# Patient Record
Sex: Male | Born: 1935 | Race: White | Hispanic: No | Marital: Married | Smoking: Former smoker
Health system: Southern US, Community
[De-identification: ages and names within clinical notes are randomized; demographics above are authoritative.]

## PROBLEM LIST (undated history)

## (undated) DIAGNOSIS — G4730 Sleep apnea, unspecified: Secondary | ICD-10-CM

## (undated) DIAGNOSIS — M1990 Unspecified osteoarthritis, unspecified site: Secondary | ICD-10-CM

## (undated) DIAGNOSIS — T7840XA Allergy, unspecified, initial encounter: Secondary | ICD-10-CM

## (undated) HISTORY — PX: REPLACEMENT TOTAL KNEE: SUR1224

## (undated) HISTORY — DX: Unspecified osteoarthritis, unspecified site: M19.90

## (undated) HISTORY — DX: Sleep apnea, unspecified: G47.30

## (undated) HISTORY — DX: Allergy, unspecified, initial encounter: T78.40XA

---

## 2004-12-26 ENCOUNTER — Other Ambulatory Visit: Payer: Self-pay

## 2004-12-26 ENCOUNTER — Inpatient Hospital Stay: Payer: Self-pay | Admitting: Internal Medicine

## 2005-01-08 ENCOUNTER — Encounter: Payer: Self-pay | Admitting: Internal Medicine

## 2005-01-12 ENCOUNTER — Encounter: Payer: Self-pay | Admitting: Internal Medicine

## 2005-02-11 ENCOUNTER — Encounter: Payer: Self-pay | Admitting: Internal Medicine

## 2011-03-01 ENCOUNTER — Ambulatory Visit: Payer: Self-pay | Admitting: Orthopedic Surgery

## 2011-03-25 ENCOUNTER — Ambulatory Visit: Payer: Self-pay | Admitting: Orthopedic Surgery

## 2011-03-25 DIAGNOSIS — Z01810 Encounter for preprocedural cardiovascular examination: Secondary | ICD-10-CM

## 2011-04-02 ENCOUNTER — Inpatient Hospital Stay: Payer: Self-pay | Admitting: Orthopedic Surgery

## 2011-04-05 LAB — PATHOLOGY REPORT

## 2011-04-08 ENCOUNTER — Encounter: Payer: Self-pay | Admitting: Internal Medicine

## 2011-04-14 ENCOUNTER — Encounter: Payer: Self-pay | Admitting: Internal Medicine

## 2011-05-15 ENCOUNTER — Encounter: Payer: Self-pay | Admitting: Internal Medicine

## 2012-06-24 IMAGING — CT CT OF THE RIGHT KNEE WITHOUT CONTRAST
2 of 3 series · 10 of 14 positions shown, 11 images · non-contrast
Comparison: none

REASON FOR EXAM: MY KNEE CT SCAN right knee pain
COMMENTS:

PROCEDURE:     CT  - CT KNEE RIGHT WO  - March 01, 2011  [DATE]
RESULT:     This study was performed prior to right knee replacement. This
is a preplanning evaluation.

[Series 2: hip · axial · 0.39mm/px · z∈[+88,+352]mm · 3 of 177 slices shown, 4 images]
[im 1/177  soft-tissue]
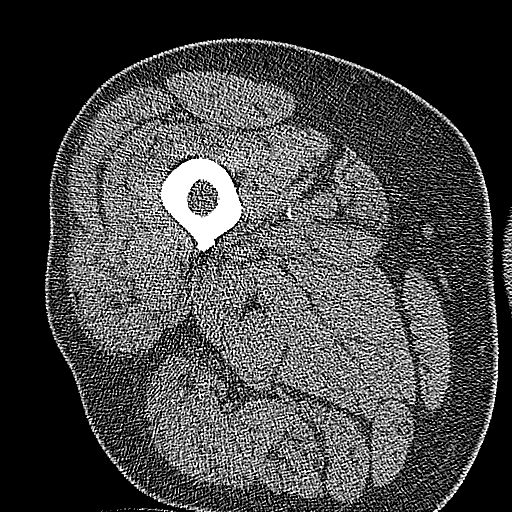
[im 1/177  bone]
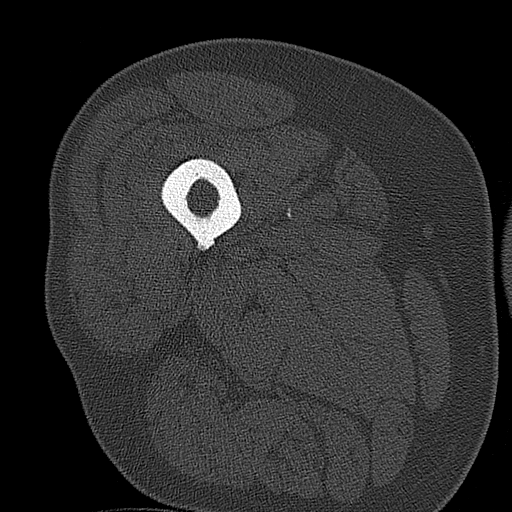
[im 89/177  bone]
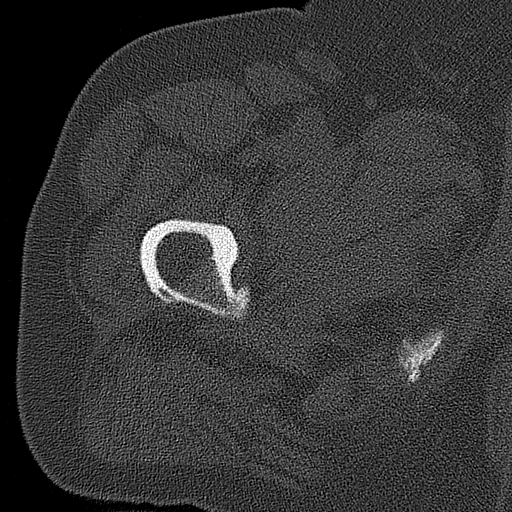
[im 177/177  bone]
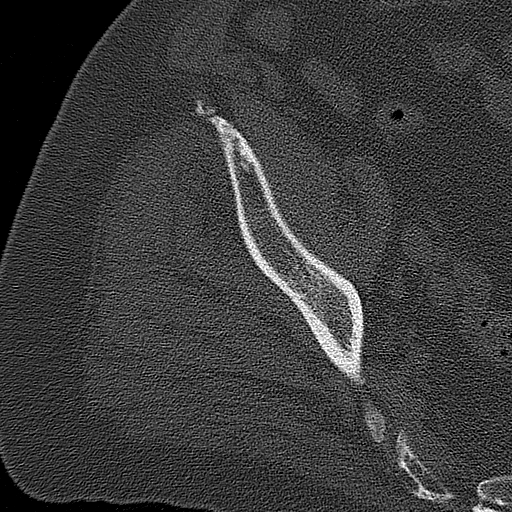

[Series 4: knee (id) smooth · axial · 0.39mm/px · z∈[-249,-21]mm · 7 of 610 slices shown]
[im 77/610  soft-tissue]
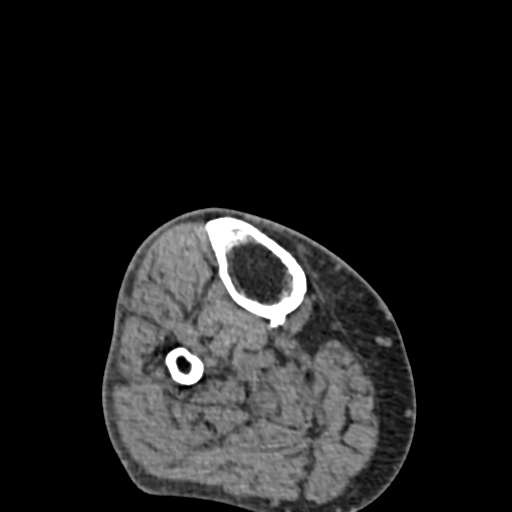
[im 153/610  soft-tissue]
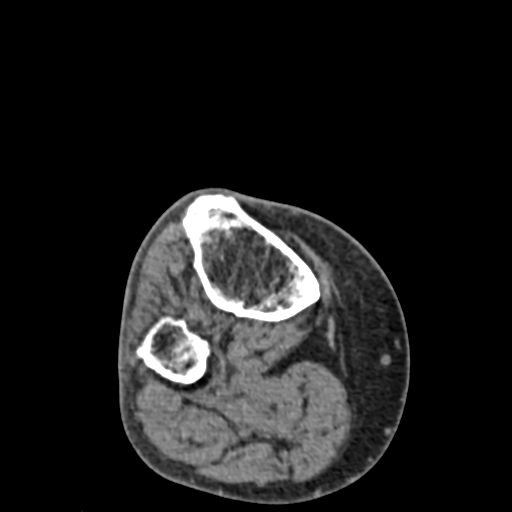
[im 229/610  soft-tissue]
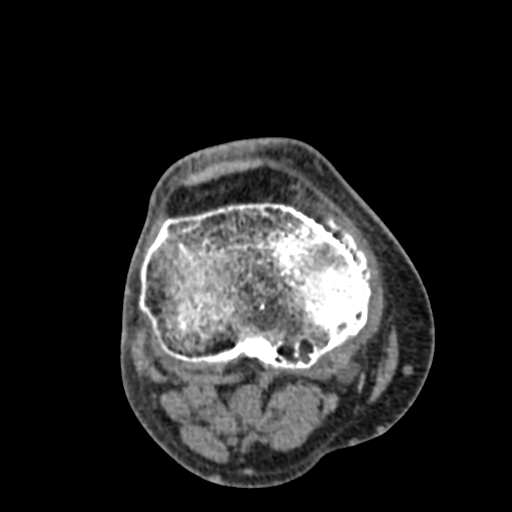
[im 305/610  soft-tissue]
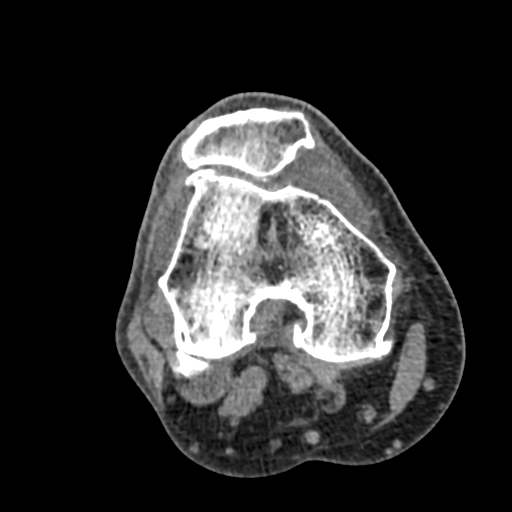
[im 381/610  soft-tissue]
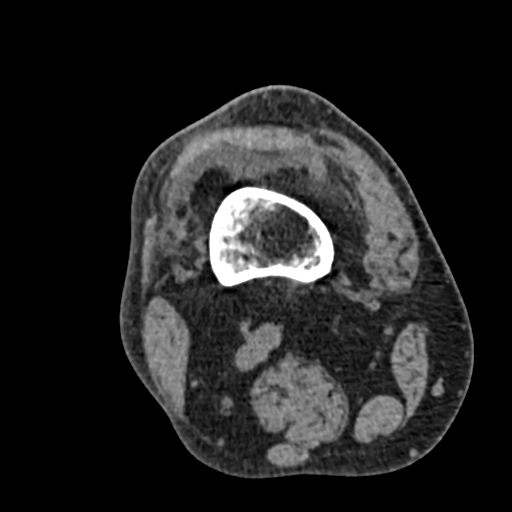
[im 457/610  soft-tissue]
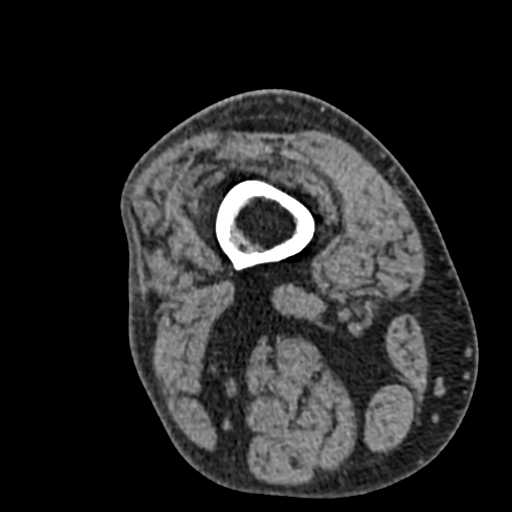
[im 533/610  soft-tissue]
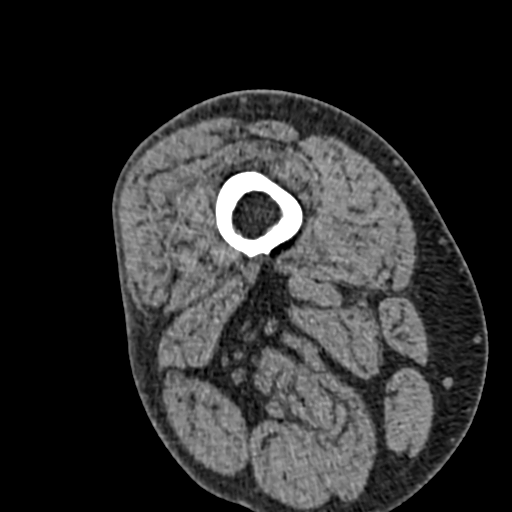

[10 of 14 positions shown; findings below may reference images not displayed]

FINDINGS: Evaluation of the right knee demonstrates severe osteoarthritic
changes. There is osteophytosis, subchondral cyst formation and subchondral
sclerosis. Severe degenerative changes are also identified within the ankle
with similar findings. The remaining osseous structures demonstrate no
further abnormalities. The surrounding soft tissues are unremarkable.
IMPRESSION: Severe osteoarthritic changes involving the knee and ankle
as described above.

## 2013-03-14 ENCOUNTER — Emergency Department: Payer: Self-pay | Admitting: Emergency Medicine

## 2013-03-14 LAB — CBC
HGB: 12.7 g/dL — ABNORMAL LOW (ref 13.0–18.0)
MCH: 32.9 pg (ref 26.0–34.0)
MCHC: 34 g/dL (ref 32.0–36.0)
MCV: 97 fL (ref 80–100)
Platelet: 158 10*3/uL (ref 150–440)
WBC: 9.9 10*3/uL (ref 3.8–10.6)

## 2013-03-14 LAB — BASIC METABOLIC PANEL
BUN: 23 mg/dL — ABNORMAL HIGH (ref 7–18)
Co2: 29 mmol/L (ref 21–32)
Creatinine: 1.1 mg/dL (ref 0.60–1.30)
EGFR (Non-African Amer.): >60
Glucose: 121 mg/dL — ABNORMAL HIGH (ref 65–99)
Potassium: 4.5 mmol/L (ref 3.5–5.1)
Sodium: 143 mmol/L (ref 136–145)

## 2013-04-05 ENCOUNTER — Emergency Department: Payer: Self-pay | Admitting: Emergency Medicine

## 2013-04-05 LAB — BASIC METABOLIC PANEL
Chloride: 109 mmol/L — ABNORMAL HIGH (ref 98–107)
Glucose: 111 mg/dL — ABNORMAL HIGH (ref 65–99)
Osmolality: 286 (ref 275–301)
Sodium: 142 mmol/L (ref 136–145)

## 2013-04-05 LAB — CBC
HCT: 37.1 % — ABNORMAL LOW (ref 40.0–52.0)
HGB: 12.9 g/dL — ABNORMAL LOW (ref 13.0–18.0)
MCH: 33.3 pg (ref 26.0–34.0)
Platelet: 159 10*3/uL (ref 150–440)
RBC: 3.86 10*6/uL — ABNORMAL LOW (ref 4.40–5.90)
WBC: 7.4 10*3/uL (ref 3.8–10.6)

## 2013-04-05 LAB — URINALYSIS, COMPLETE
Bacteria: NONE SEEN
Bilirubin,UR: NEGATIVE
Blood: NEGATIVE
Ketone: NEGATIVE
Nitrite: NEGATIVE
Ph: 5 (ref 4.5–8.0)
Specific Gravity: 1.015 (ref 1.003–1.030)

## 2013-08-16 ENCOUNTER — Ambulatory Visit: Payer: Self-pay | Admitting: Orthopedic Surgery

## 2013-09-28 DIAGNOSIS — L120 Bullous pemphigoid: Secondary | ICD-10-CM | POA: Insufficient documentation

## 2014-05-18 ENCOUNTER — Ambulatory Visit: Payer: Self-pay | Admitting: Podiatry

## 2014-07-08 IMAGING — CR DG CHEST 2V
1 series · 2 of 2 positions shown · non-contrast
Comparison: none

REASON FOR EXAM: SOB
COMMENTS:

PROCEDURE:     DXR - DXR CHEST PA (OR AP) AND LATERAL  - March 14, 2013  [DATE]
RESULT:     There is no previous exam for comparison.
The cardiac silhouette is enlarged. There is no edema, infiltrate, effusion,
mass or pneumothorax. Degenerative bony changes are present.

[Series 1: w chest pa · 0.14mm/px · 2 of 2 slices shown]
[im 1/2]
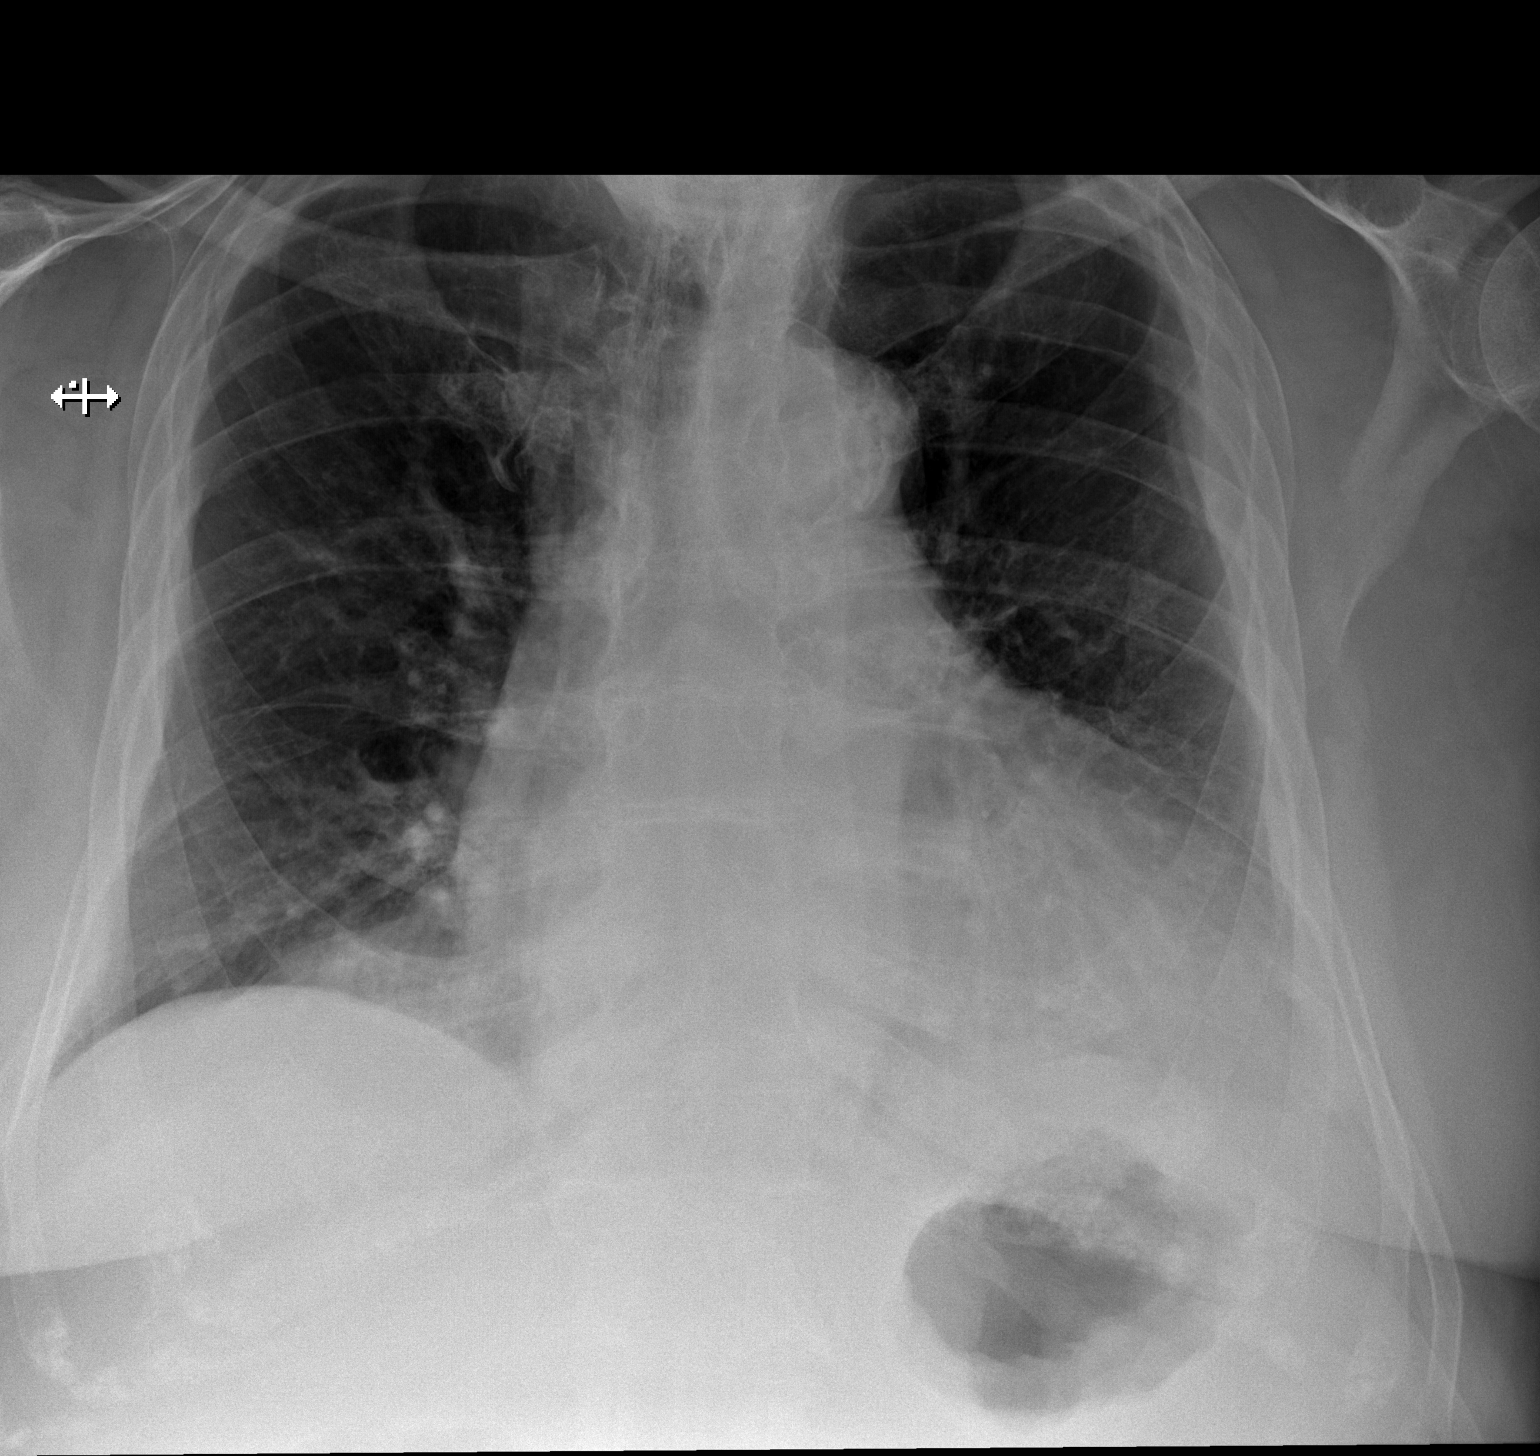
[im 2/2]
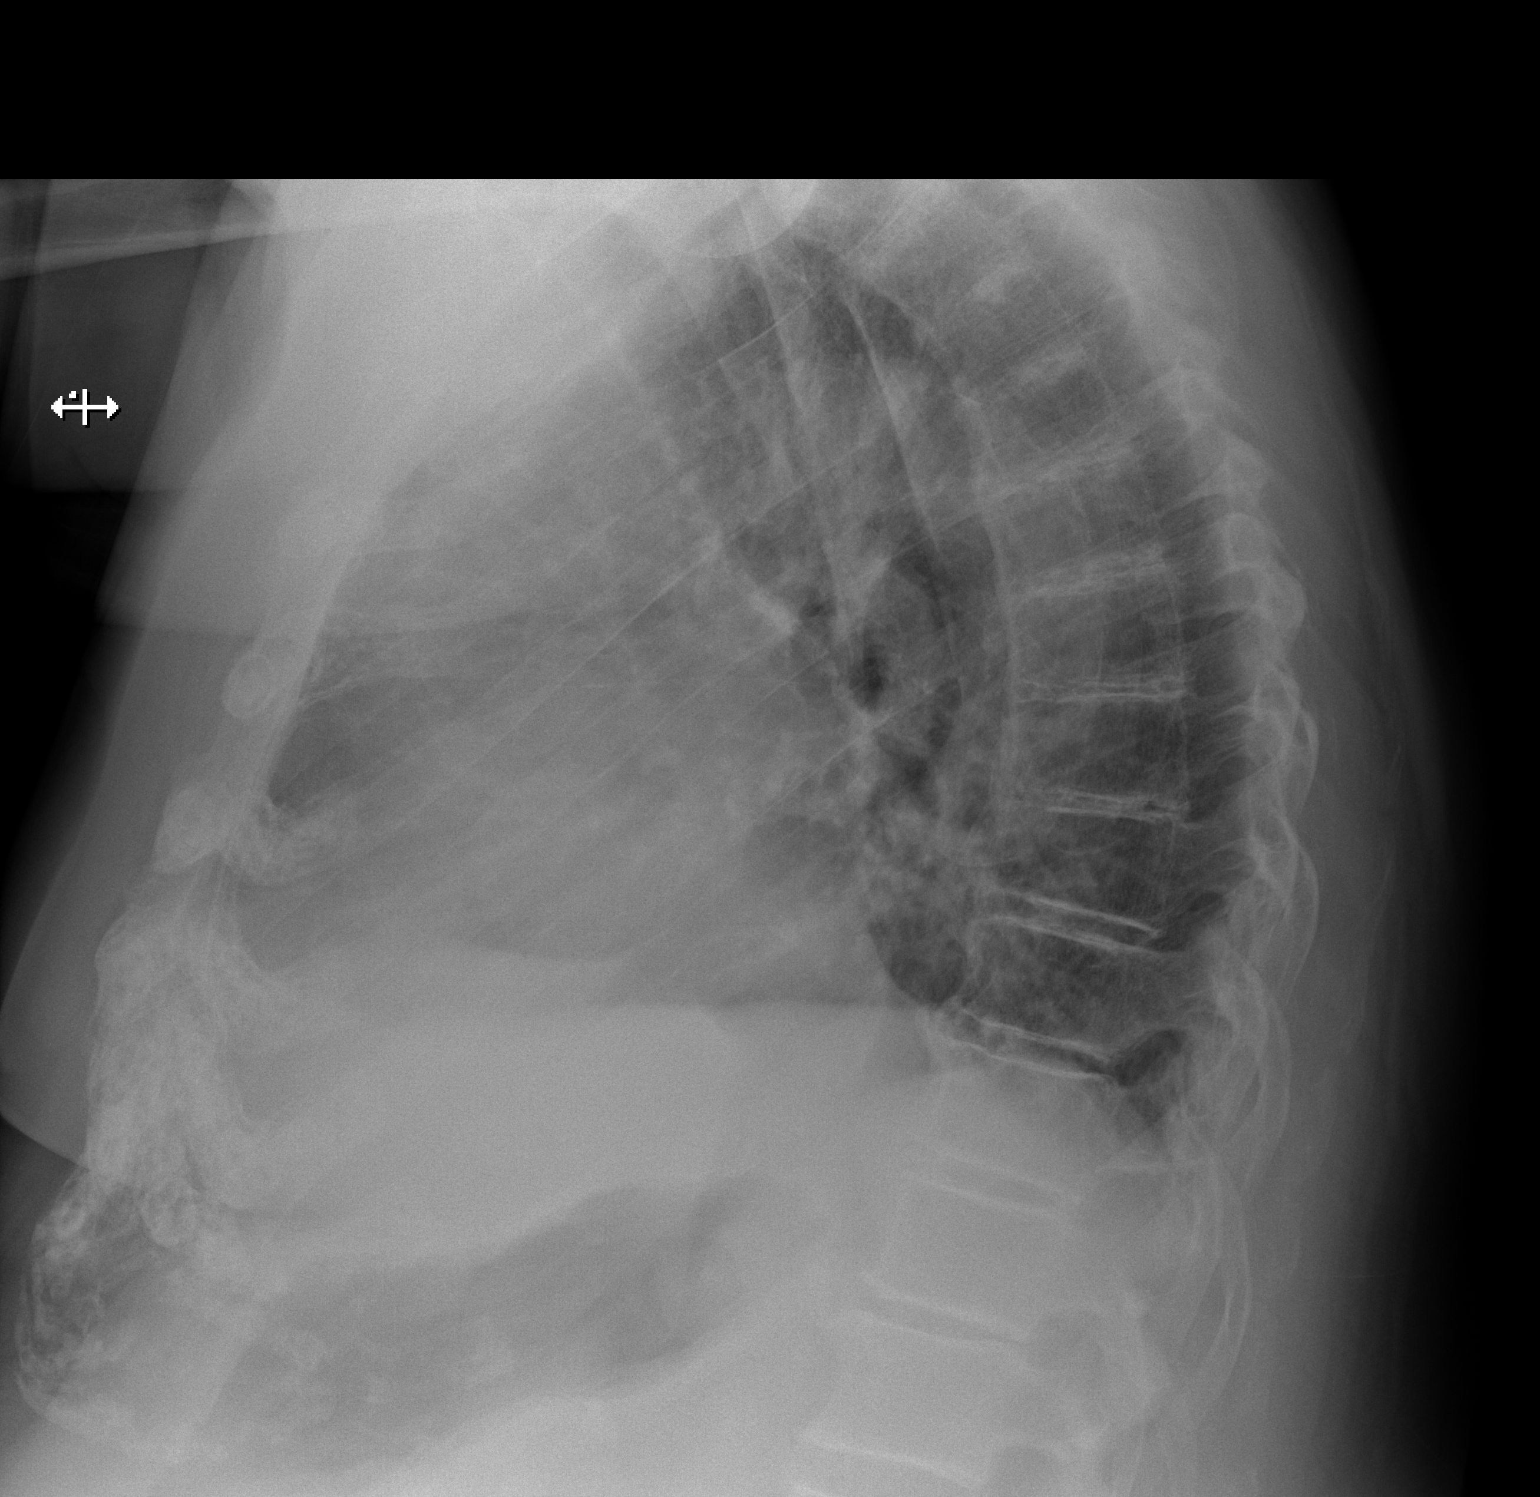

[2 of 2 positions shown; findings below may reference images not displayed]

IMPRESSION: 1. Cardiomegaly. The lungs appear clear.

[REDACTED]

## 2014-08-10 ENCOUNTER — Other Ambulatory Visit: Payer: Self-pay | Admitting: *Deleted

## 2014-08-10 ENCOUNTER — Ambulatory Visit (INDEPENDENT_AMBULATORY_CARE_PROVIDER_SITE_OTHER): Payer: Commercial Managed Care - HMO

## 2014-08-10 ENCOUNTER — Ambulatory Visit (INDEPENDENT_AMBULATORY_CARE_PROVIDER_SITE_OTHER): Payer: Commercial Managed Care - HMO | Admitting: Podiatry

## 2014-08-10 ENCOUNTER — Encounter: Payer: Self-pay | Admitting: Podiatry

## 2014-08-10 VITALS — BP 132/69 | HR 77 | Resp 16 | Ht 70.0 in | Wt 235.0 lb

## 2014-08-10 DIAGNOSIS — M7750 Other enthesopathy of unspecified foot: Secondary | ICD-10-CM

## 2014-08-10 DIAGNOSIS — M1990 Unspecified osteoarthritis, unspecified site: Secondary | ICD-10-CM

## 2014-08-10 NOTE — Progress Notes (Signed)
   Subjective:    Patient ID: Todd Wall, male    DOB: 08/12/1926, 80 y.o.   MRN: [id number]  HPI Comments: Both ankles are hurting , they have been getting worse the last 5 years. Hurts in the joints.  It gets so sore i can barely walk. If on it all day the night it worse. Cant stand for longer thank 15 minutes   Foot Pain      Review of Systems  HENT:       Sinus problems   All other systems reviewed and are negative.      Objective:   Physical Exam: I have reviewed his past medical history medications allergies surgery social history and review of systems area pulses are strongly palpable bilateral. Neurologic sensorium is intact per Semmes-Weinstein monofilament. Deep tendon reflexes are intact bilateral muscle strength +5 over 5 dorsiflexion plantar flexion inversion everters all of his musculature is intact. Orthopedic evaluation of his trace pain on range of motion of the ankles of the ankle joint. Pain on medial and lateral compression of the bilateral ankles. Radiographic evaluation does demonstrate considerable osteoarthritis the bilateral ankles with joint space narrowing and ankle mortise abnormalities. Spurring and joint space narrowing is noted indicative of osteoarthritis      Assessment & Plan:  Assessment: Osteoarthritis bilateral ankles. Capsulitis bilateral ankles.  Plan: Injected cortisone to the bilateral ankles today I will follow up with him as needed.

## 2014-10-25 DIAGNOSIS — L718 Other rosacea: Secondary | ICD-10-CM | POA: Diagnosis not present

## 2014-10-25 DIAGNOSIS — L821 Other seborrheic keratosis: Secondary | ICD-10-CM | POA: Diagnosis not present

## 2014-10-25 DIAGNOSIS — R21 Rash and other nonspecific skin eruption: Secondary | ICD-10-CM | POA: Diagnosis not present

## 2014-10-25 DIAGNOSIS — L120 Bullous pemphigoid: Secondary | ICD-10-CM | POA: Diagnosis not present

## 2014-10-25 DIAGNOSIS — Z79899 Other long term (current) drug therapy: Secondary | ICD-10-CM | POA: Diagnosis not present

## 2014-10-31 DIAGNOSIS — R21 Rash and other nonspecific skin eruption: Secondary | ICD-10-CM | POA: Diagnosis not present

## 2014-10-31 DIAGNOSIS — Z79899 Other long term (current) drug therapy: Secondary | ICD-10-CM | POA: Diagnosis not present

## 2015-01-10 DIAGNOSIS — F5104 Psychophysiologic insomnia: Secondary | ICD-10-CM | POA: Diagnosis not present

## 2015-01-10 DIAGNOSIS — D508 Other iron deficiency anemias: Secondary | ICD-10-CM | POA: Diagnosis not present

## 2015-01-10 DIAGNOSIS — D608 Other acquired pure red cell aplasias: Secondary | ICD-10-CM | POA: Diagnosis not present

## 2015-01-31 DIAGNOSIS — L820 Inflamed seborrheic keratosis: Secondary | ICD-10-CM | POA: Diagnosis not present

## 2015-01-31 DIAGNOSIS — L120 Bullous pemphigoid: Secondary | ICD-10-CM | POA: Diagnosis not present

## 2015-02-14 DIAGNOSIS — Z79899 Other long term (current) drug therapy: Secondary | ICD-10-CM | POA: Diagnosis not present

## 2015-03-02 DIAGNOSIS — J209 Acute bronchitis, unspecified: Secondary | ICD-10-CM | POA: Diagnosis not present

## 2015-03-03 DIAGNOSIS — R0602 Shortness of breath: Secondary | ICD-10-CM | POA: Diagnosis not present

## 2015-03-03 DIAGNOSIS — R062 Wheezing: Secondary | ICD-10-CM | POA: Diagnosis not present

## 2015-03-03 DIAGNOSIS — R05 Cough: Secondary | ICD-10-CM | POA: Diagnosis not present

## 2015-03-03 DIAGNOSIS — J449 Chronic obstructive pulmonary disease, unspecified: Secondary | ICD-10-CM | POA: Diagnosis not present

## 2015-03-05 DIAGNOSIS — J40 Bronchitis, not specified as acute or chronic: Secondary | ICD-10-CM | POA: Diagnosis not present

## 2015-03-16 DIAGNOSIS — R0602 Shortness of breath: Secondary | ICD-10-CM | POA: Diagnosis not present

## 2015-03-16 DIAGNOSIS — J40 Bronchitis, not specified as acute or chronic: Secondary | ICD-10-CM | POA: Diagnosis not present

## 2015-03-16 DIAGNOSIS — R05 Cough: Secondary | ICD-10-CM | POA: Diagnosis not present

## 2015-04-06 DIAGNOSIS — G479 Sleep disorder, unspecified: Secondary | ICD-10-CM | POA: Diagnosis not present

## 2015-04-06 DIAGNOSIS — Z8709 Personal history of other diseases of the respiratory system: Secondary | ICD-10-CM | POA: Diagnosis not present

## 2015-04-13 DIAGNOSIS — R062 Wheezing: Secondary | ICD-10-CM | POA: Diagnosis not present

## 2015-04-13 DIAGNOSIS — B9689 Other specified bacterial agents as the cause of diseases classified elsewhere: Secondary | ICD-10-CM | POA: Diagnosis not present

## 2015-04-13 DIAGNOSIS — J208 Acute bronchitis due to other specified organisms: Secondary | ICD-10-CM | POA: Diagnosis not present

## 2015-04-20 DIAGNOSIS — R0602 Shortness of breath: Secondary | ICD-10-CM | POA: Diagnosis not present

## 2015-04-20 DIAGNOSIS — R05 Cough: Secondary | ICD-10-CM | POA: Diagnosis not present

## 2015-06-02 DIAGNOSIS — R21 Rash and other nonspecific skin eruption: Secondary | ICD-10-CM | POA: Diagnosis not present

## 2015-06-02 DIAGNOSIS — L988 Other specified disorders of the skin and subcutaneous tissue: Secondary | ICD-10-CM | POA: Diagnosis not present

## 2015-06-02 DIAGNOSIS — L120 Bullous pemphigoid: Secondary | ICD-10-CM | POA: Diagnosis not present

## 2015-06-08 DIAGNOSIS — Z79899 Other long term (current) drug therapy: Secondary | ICD-10-CM | POA: Diagnosis not present

## 2015-06-09 DIAGNOSIS — L120 Bullous pemphigoid: Secondary | ICD-10-CM | POA: Diagnosis not present

## 2015-06-09 DIAGNOSIS — R21 Rash and other nonspecific skin eruption: Secondary | ICD-10-CM | POA: Diagnosis not present

## 2015-06-09 DIAGNOSIS — T50904A Poisoning by unspecified drugs, medicaments and biological substances, undetermined, initial encounter: Secondary | ICD-10-CM | POA: Diagnosis not present

## 2015-08-28 DIAGNOSIS — H6523 Chronic serous otitis media, bilateral: Secondary | ICD-10-CM | POA: Diagnosis not present

## 2015-08-28 DIAGNOSIS — Z23 Encounter for immunization: Secondary | ICD-10-CM | POA: Diagnosis not present

## 2015-09-26 DIAGNOSIS — H65192 Other acute nonsuppurative otitis media, left ear: Secondary | ICD-10-CM | POA: Diagnosis not present

## 2015-10-04 DIAGNOSIS — H6502 Acute serous otitis media, left ear: Secondary | ICD-10-CM | POA: Diagnosis not present

## 2015-10-04 DIAGNOSIS — R21 Rash and other nonspecific skin eruption: Secondary | ICD-10-CM | POA: Diagnosis not present

## 2015-10-04 DIAGNOSIS — R7309 Other abnormal glucose: Secondary | ICD-10-CM | POA: Diagnosis not present

## 2015-10-26 DIAGNOSIS — H6502 Acute serous otitis media, left ear: Secondary | ICD-10-CM | POA: Diagnosis not present

## 2015-10-26 DIAGNOSIS — H90A11 Conductive hearing loss, unilateral, right ear with restricted hearing on the contralateral side: Secondary | ICD-10-CM | POA: Diagnosis not present

## 2015-10-26 DIAGNOSIS — H6982 Other specified disorders of Eustachian tube, left ear: Secondary | ICD-10-CM | POA: Diagnosis not present

## 2015-10-26 DIAGNOSIS — H903 Sensorineural hearing loss, bilateral: Secondary | ICD-10-CM | POA: Diagnosis not present

## 2015-10-31 DIAGNOSIS — M545 Low back pain: Secondary | ICD-10-CM | POA: Diagnosis not present

## 2015-11-16 DIAGNOSIS — H902 Conductive hearing loss, unspecified: Secondary | ICD-10-CM | POA: Diagnosis not present

## 2015-11-16 DIAGNOSIS — H6502 Acute serous otitis media, left ear: Secondary | ICD-10-CM | POA: Diagnosis not present

## 2015-11-16 DIAGNOSIS — H6982 Other specified disorders of Eustachian tube, left ear: Secondary | ICD-10-CM | POA: Diagnosis not present

## 2015-12-28 DIAGNOSIS — H902 Conductive hearing loss, unspecified: Secondary | ICD-10-CM | POA: Diagnosis not present

## 2015-12-28 DIAGNOSIS — H6982 Other specified disorders of Eustachian tube, left ear: Secondary | ICD-10-CM | POA: Diagnosis not present

## 2015-12-28 DIAGNOSIS — H6522 Chronic serous otitis media, left ear: Secondary | ICD-10-CM | POA: Diagnosis not present

## 2016-01-18 DIAGNOSIS — H903 Sensorineural hearing loss, bilateral: Secondary | ICD-10-CM | POA: Diagnosis not present

## 2016-01-23 DIAGNOSIS — J209 Acute bronchitis, unspecified: Secondary | ICD-10-CM | POA: Diagnosis not present

## 2016-01-23 DIAGNOSIS — R05 Cough: Secondary | ICD-10-CM | POA: Diagnosis not present

## 2016-01-30 DIAGNOSIS — L120 Bullous pemphigoid: Secondary | ICD-10-CM | POA: Diagnosis not present

## 2016-01-30 DIAGNOSIS — L853 Xerosis cutis: Secondary | ICD-10-CM | POA: Diagnosis not present

## 2016-01-30 DIAGNOSIS — D692 Other nonthrombocytopenic purpura: Secondary | ICD-10-CM | POA: Diagnosis not present

## 2016-02-09 DIAGNOSIS — Z79899 Other long term (current) drug therapy: Secondary | ICD-10-CM | POA: Diagnosis not present

## 2016-02-16 DIAGNOSIS — M25571 Pain in right ankle and joints of right foot: Secondary | ICD-10-CM | POA: Diagnosis not present

## 2016-02-16 DIAGNOSIS — M25572 Pain in left ankle and joints of left foot: Secondary | ICD-10-CM | POA: Diagnosis not present

## 2016-02-21 ENCOUNTER — Ambulatory Visit: Payer: Commercial Managed Care - HMO | Admitting: Podiatry

## 2016-03-07 DIAGNOSIS — M19071 Primary osteoarthritis, right ankle and foot: Secondary | ICD-10-CM | POA: Diagnosis not present

## 2016-03-07 DIAGNOSIS — M19072 Primary osteoarthritis, left ankle and foot: Secondary | ICD-10-CM | POA: Diagnosis not present

## 2016-04-17 DIAGNOSIS — R600 Localized edema: Secondary | ICD-10-CM | POA: Diagnosis not present

## 2016-11-12 DIAGNOSIS — R238 Other skin changes: Secondary | ICD-10-CM | POA: Diagnosis not present

## 2016-11-12 DIAGNOSIS — Z79899 Other long term (current) drug therapy: Secondary | ICD-10-CM | POA: Diagnosis not present

## 2016-11-12 DIAGNOSIS — L308 Other specified dermatitis: Secondary | ICD-10-CM | POA: Diagnosis not present

## 2016-11-12 DIAGNOSIS — R21 Rash and other nonspecific skin eruption: Secondary | ICD-10-CM | POA: Diagnosis not present

## 2016-11-12 DIAGNOSIS — D649 Anemia, unspecified: Secondary | ICD-10-CM | POA: Diagnosis not present

## 2016-11-12 DIAGNOSIS — L120 Bullous pemphigoid: Secondary | ICD-10-CM | POA: Diagnosis not present

## 2016-11-19 DIAGNOSIS — L120 Bullous pemphigoid: Secondary | ICD-10-CM | POA: Diagnosis not present

## 2016-11-19 DIAGNOSIS — L309 Dermatitis, unspecified: Secondary | ICD-10-CM | POA: Diagnosis not present

## 2016-11-19 DIAGNOSIS — Z79899 Other long term (current) drug therapy: Secondary | ICD-10-CM | POA: Diagnosis not present

## 2017-01-21 DIAGNOSIS — F5104 Psychophysiologic insomnia: Secondary | ICD-10-CM | POA: Diagnosis not present

## 2017-02-11 DIAGNOSIS — R29898 Other symptoms and signs involving the musculoskeletal system: Secondary | ICD-10-CM | POA: Diagnosis not present

## 2017-02-11 DIAGNOSIS — M6281 Muscle weakness (generalized): Secondary | ICD-10-CM | POA: Diagnosis not present

## 2017-02-11 DIAGNOSIS — G4700 Insomnia, unspecified: Secondary | ICD-10-CM | POA: Diagnosis not present

## 2017-02-11 DIAGNOSIS — R2689 Other abnormalities of gait and mobility: Secondary | ICD-10-CM | POA: Diagnosis not present

## 2017-04-01 DIAGNOSIS — R350 Frequency of micturition: Secondary | ICD-10-CM | POA: Diagnosis not present

## 2017-04-01 DIAGNOSIS — M25551 Pain in right hip: Secondary | ICD-10-CM | POA: Diagnosis not present

## 2017-04-08 DIAGNOSIS — G4700 Insomnia, unspecified: Secondary | ICD-10-CM | POA: Diagnosis not present

## 2017-04-10 DIAGNOSIS — G4700 Insomnia, unspecified: Secondary | ICD-10-CM | POA: Diagnosis not present

## 2017-04-10 DIAGNOSIS — M6281 Muscle weakness (generalized): Secondary | ICD-10-CM | POA: Diagnosis not present

## 2017-04-10 DIAGNOSIS — R29898 Other symptoms and signs involving the musculoskeletal system: Secondary | ICD-10-CM | POA: Diagnosis not present

## 2017-04-10 DIAGNOSIS — R2689 Other abnormalities of gait and mobility: Secondary | ICD-10-CM | POA: Diagnosis not present

## 2017-05-10 DIAGNOSIS — M6281 Muscle weakness (generalized): Secondary | ICD-10-CM | POA: Diagnosis not present

## 2017-05-10 DIAGNOSIS — R29898 Other symptoms and signs involving the musculoskeletal system: Secondary | ICD-10-CM | POA: Diagnosis not present

## 2017-05-10 DIAGNOSIS — R2689 Other abnormalities of gait and mobility: Secondary | ICD-10-CM | POA: Diagnosis not present

## 2017-05-10 DIAGNOSIS — G4700 Insomnia, unspecified: Secondary | ICD-10-CM | POA: Diagnosis not present

## 2017-06-10 DIAGNOSIS — R2689 Other abnormalities of gait and mobility: Secondary | ICD-10-CM | POA: Diagnosis not present

## 2017-06-10 DIAGNOSIS — G4700 Insomnia, unspecified: Secondary | ICD-10-CM | POA: Diagnosis not present

## 2017-06-10 DIAGNOSIS — R29898 Other symptoms and signs involving the musculoskeletal system: Secondary | ICD-10-CM | POA: Diagnosis not present

## 2017-06-10 DIAGNOSIS — M6281 Muscle weakness (generalized): Secondary | ICD-10-CM | POA: Diagnosis not present

## 2017-07-11 DIAGNOSIS — G4700 Insomnia, unspecified: Secondary | ICD-10-CM | POA: Diagnosis not present

## 2017-07-11 DIAGNOSIS — R2689 Other abnormalities of gait and mobility: Secondary | ICD-10-CM | POA: Diagnosis not present

## 2017-07-11 DIAGNOSIS — R29898 Other symptoms and signs involving the musculoskeletal system: Secondary | ICD-10-CM | POA: Diagnosis not present

## 2017-07-11 DIAGNOSIS — M6281 Muscle weakness (generalized): Secondary | ICD-10-CM | POA: Diagnosis not present

## 2017-07-15 DIAGNOSIS — Z23 Encounter for immunization: Secondary | ICD-10-CM | POA: Diagnosis not present

## 2017-07-15 DIAGNOSIS — S61412A Laceration without foreign body of left hand, initial encounter: Secondary | ICD-10-CM | POA: Diagnosis not present

## 2017-07-17 DIAGNOSIS — S61412D Laceration without foreign body of left hand, subsequent encounter: Secondary | ICD-10-CM | POA: Diagnosis not present

## 2017-08-10 DIAGNOSIS — R2689 Other abnormalities of gait and mobility: Secondary | ICD-10-CM | POA: Diagnosis not present

## 2017-08-10 DIAGNOSIS — M6281 Muscle weakness (generalized): Secondary | ICD-10-CM | POA: Diagnosis not present

## 2017-08-10 DIAGNOSIS — G4700 Insomnia, unspecified: Secondary | ICD-10-CM | POA: Diagnosis not present

## 2017-08-10 DIAGNOSIS — R29898 Other symptoms and signs involving the musculoskeletal system: Secondary | ICD-10-CM | POA: Diagnosis not present

## 2017-09-08 DIAGNOSIS — Z0000 Encounter for general adult medical examination without abnormal findings: Secondary | ICD-10-CM | POA: Diagnosis not present

## 2017-09-08 DIAGNOSIS — L821 Other seborrheic keratosis: Secondary | ICD-10-CM | POA: Diagnosis not present

## 2017-09-08 DIAGNOSIS — L120 Bullous pemphigoid: Secondary | ICD-10-CM | POA: Diagnosis not present

## 2017-09-08 DIAGNOSIS — Z111 Encounter for screening for respiratory tuberculosis: Secondary | ICD-10-CM | POA: Diagnosis not present

## 2017-09-08 DIAGNOSIS — D692 Other nonthrombocytopenic purpura: Secondary | ICD-10-CM | POA: Diagnosis not present

## 2017-09-08 DIAGNOSIS — L308 Other specified dermatitis: Secondary | ICD-10-CM | POA: Diagnosis not present

## 2017-09-10 DIAGNOSIS — R2689 Other abnormalities of gait and mobility: Secondary | ICD-10-CM | POA: Diagnosis not present

## 2017-09-10 DIAGNOSIS — G4700 Insomnia, unspecified: Secondary | ICD-10-CM | POA: Diagnosis not present

## 2017-09-10 DIAGNOSIS — R29898 Other symptoms and signs involving the musculoskeletal system: Secondary | ICD-10-CM | POA: Diagnosis not present

## 2017-09-10 DIAGNOSIS — M6281 Muscle weakness (generalized): Secondary | ICD-10-CM | POA: Diagnosis not present

## 2017-09-29 ENCOUNTER — Encounter: Payer: Medicare PPO | Attending: Family Medicine | Admitting: Dietician

## 2017-09-29 ENCOUNTER — Encounter: Payer: Self-pay | Admitting: Dietician

## 2017-09-29 VITALS — BP 136/68 | Ht 70.0 in | Wt 249.5 lb

## 2017-09-29 DIAGNOSIS — Z713 Dietary counseling and surveillance: Secondary | ICD-10-CM | POA: Diagnosis not present

## 2017-09-29 DIAGNOSIS — E119 Type 2 diabetes mellitus without complications: Secondary | ICD-10-CM | POA: Insufficient documentation

## 2017-09-29 NOTE — Progress Notes (Signed)
Diabetes Self-Management Education  Visit Type: First/Initial  Appt. Start Time: 1100 Appt. End Time: 1215  09/29/2017  Mr. Todd Wall, identified by name and date of birth, is a 83 y.o. male with a diagnosis of Diabetes: Type 2.   ASSESSMENT  Blood pressure 136/68, height 5\' 10"  (1.778 m), weight 249 lb 8 oz (113.2 kg). Body mass index is 35.8 kg/m.  Diabetes Self-Management Education - 09/29/17 1336      Visit Information   Visit Type  First/Initial      Initial Visit   Diabetes Type  Type 2      Health Coping   How would you rate your overall health?  Good      Psychosocial Assessment   Patient Belief/Attitude about Diabetes  Motivated to manage diabetes    Self-care barriers  Unsteady gait/risk for falls uses scooter chair    Self-management support  Doctor's office;Family    Other persons present  Patient;Spouse/SO    Patient Concerns  Nutrition/Meal planning    Special Needs  None    Preferred Learning Style  Visual    Learning Readiness  Ready      Pre-Education Assessment   Patient understands the diabetes disease and treatment process.  Needs Instruction    Patient understands incorporating nutritional management into lifestyle.  Needs Instruction    Patient undertands incorporating physical activity into lifestyle.  Needs Instruction    Patient understands using medications safely.  Needs Instruction    Patient understands monitoring blood glucose, interpreting and using results  Needs Instruction    Patient understands prevention, detection, and treatment of acute complications.  Needs Instruction    Patient understands prevention, detection, and treatment of chronic complications.  Needs Instruction    Patient understands how to develop strategies to address psychosocial issues.  Needs Instruction    Patient understands how to develop strategies to promote health/change behavior.  Needs Instruction      Complications   Last HgB A1C per patient/outside  source  6.7 % 09-08-17    How often do you check your blood sugar?  0 times/day (not testing)    Have you had a dilated eye exam in the past 12 months?  No 3 year ago    Have you had a dental exam in the past 12 months?  No 3 yr ago    Are you checking your feet?  No      Dietary Intake   Breakfast  eats 2 eggs and bagel with cream cheese at 10-11a    Snack (morning)  no    Lunch  eats out daily for lunch-eats fried chicken strips at [employer] (eats lunch at 3p-4p)   Snack (afternoon)  no    Dinner  eats at [employer]7p-8p    Snack (evening)  eats occasional peanuts at 10p    Beverage(s)  drinks water 1-2x/day and artificially sweetened beverages 4-5x/day      Exercise   Exercise Type  -- pt has limited mobility-rides in scooter chair      Patient Education   Previous Diabetes Education  No    Disease state   Definition of diabetes, type 1 and 2, and the diagnosis of diabetes;Explored patient's options for treatment of their diabetes    Nutrition management   Role of diet in the treatment of diabetes and the relationship between the three main macronutrients and blood glucose level;Carbohydrate counting;Food label reading, portion sizes and measuring food.    Physical activity and exercise  Role of exercise on diabetes management, blood pressure control and cardiac health.    Monitoring  Taught/evaluated SMBG meter.;Taught/discussed recording of test results and interpretation of SMBG.;Identified appropriate SMBG and/or A1C goals.;Purpose and frequency of SMBG. gave pt One Touch Verio Flex & instructed on its use-BG 142 (2 hr PP)    Chronic complications  Relationship between chronic complications and blood glucose control;Dental care;Retinopathy and reason for yearly dilated eye exams    Personal strategies to promote health  Lifestyle issues that need to be addressed for better diabetes care;Helped patient develop diabetes management plan for (enter comment)      Outcomes   Expected Outcomes   Demonstrated interest in learning. Expect positive outcomes       Individualized Plan for Diabetes Self-Management Training:   Learning Objective:  Patient will have a greater understanding of diabetes self-management. Patient education plan is to attend individual and/or group sessions per assessed needs and concerns.   Plan:   Patient Instructions   Check blood sugars 2 x day before breakfast and 2 hrs after supper every day and record Bring blood sugar records to the next appointment/class Call your doctor for a prescription for:  1. Meter strips (type) One Touch Verio test strips  checking  2 times per day  2. Lancets (type) One Touch Delica Lancets checking 2                      times per day Exercise:  Try some exercises in "Workout To Go" handout Eat 3 meals day,  1  snack at bedtime Eat 3 carbohydrate servings/meal + protein Eat 1 carbohydrate serving/snack + protein Space meals 4-5 hours apart Avoid sugar sweetened drinks and fruit juices  Limit intake of fried foods and sweets/desserts Make dentist / eye doctor appointments Get a Sharps container Return for appointment/classes on: 10-16-17   Expected Outcomes:  Demonstrated interest in learning. Expect positive outcomes  Education material provided:General meal planning guidelines, Workout To Go handout, One Touch Verio Flex meter   If problems or questions, patient to contact team via: [phone]  Future DSME appointment:  10-16-17

## 2017-09-29 NOTE — Patient Instructions (Signed)
  Check blood sugars 2 x day before breakfast and 2 hrs after supper every day and record Bring blood sugar records to the next appointment/class Call your doctor for a prescription for:  1. Meter strips (type) One Touch Verio test strips  checking  2 times per day  2. Lancets (type) One Touch Delica Lancets checking 2                      times per day Exercise:  Try some exercises in "Workout To Go" handout Eat 3 meals day,  1  snack at bedtime Eat 3 carbohydrate servings/meal + protein Eat 1 carbohydrate serving/snack + protein Space meals 4-5 hours apart Avoid sugar sweetened drinks and fruit juices  Limit intake of fried foods and sweets/desserts Make dentist / eye doctor appointments Get a Sharps container Return for appointment/classes on: 10-16-17

## 2017-10-02 ENCOUNTER — Encounter: Payer: Self-pay | Admitting: *Deleted

## 2017-10-02 NOTE — Progress Notes (Signed)
Pt's wife called today and reported difficulty with using their glucometer. [facility] are [facility] and only have a few left due to multiple error messages. Pt and wife come to office and did a return demonstration with the meter (One Touch Verio Flex). Reading was 178 mg/dL at 0:863:45 pm - 5 hrs pp. Instructed them to check 3-4 x week until supplies come from [employer].  Provided extra bottle of strips and instructed them to call back for any questions. They will return for class in January.

## 2017-10-10 DIAGNOSIS — R29898 Other symptoms and signs involving the musculoskeletal system: Secondary | ICD-10-CM | POA: Diagnosis not present

## 2017-10-10 DIAGNOSIS — R2689 Other abnormalities of gait and mobility: Secondary | ICD-10-CM | POA: Diagnosis not present

## 2017-10-10 DIAGNOSIS — G4700 Insomnia, unspecified: Secondary | ICD-10-CM | POA: Diagnosis not present

## 2017-10-10 DIAGNOSIS — M6281 Muscle weakness (generalized): Secondary | ICD-10-CM | POA: Diagnosis not present

## 2017-10-16 ENCOUNTER — Encounter: Payer: Medicare PPO | Attending: Family Medicine | Admitting: Dietician

## 2017-10-16 ENCOUNTER — Encounter: Payer: Self-pay | Admitting: Dietician

## 2017-10-16 VITALS — Ht 70.0 in | Wt 251.7 lb

## 2017-10-16 DIAGNOSIS — E119 Type 2 diabetes mellitus without complications: Secondary | ICD-10-CM | POA: Diagnosis not present

## 2017-10-16 DIAGNOSIS — Z713 Dietary counseling and surveillance: Secondary | ICD-10-CM | POA: Insufficient documentation

## 2017-10-16 NOTE — Progress Notes (Signed)

## 2017-10-23 ENCOUNTER — Encounter: Payer: Medicare PPO | Admitting: *Deleted

## 2017-10-23 ENCOUNTER — Encounter: Payer: Self-pay | Admitting: *Deleted

## 2017-10-23 VITALS — Wt 248.9 lb

## 2017-10-23 DIAGNOSIS — E119 Type 2 diabetes mellitus without complications: Secondary | ICD-10-CM | POA: Diagnosis not present

## 2017-10-23 DIAGNOSIS — Z713 Dietary counseling and surveillance: Secondary | ICD-10-CM | POA: Diagnosis not present

## 2017-10-23 NOTE — Progress Notes (Signed)

## 2017-10-27 DIAGNOSIS — R0602 Shortness of breath: Secondary | ICD-10-CM | POA: Diagnosis not present

## 2017-10-27 DIAGNOSIS — E119 Type 2 diabetes mellitus without complications: Secondary | ICD-10-CM | POA: Diagnosis not present

## 2017-10-27 DIAGNOSIS — R42 Dizziness and giddiness: Secondary | ICD-10-CM | POA: Diagnosis not present

## 2017-10-27 DIAGNOSIS — L120 Bullous pemphigoid: Secondary | ICD-10-CM | POA: Diagnosis not present

## 2017-10-27 DIAGNOSIS — R2681 Unsteadiness on feet: Secondary | ICD-10-CM | POA: Diagnosis not present

## 2017-10-30 ENCOUNTER — Encounter: Payer: Self-pay | Admitting: Dietician

## 2017-10-30 ENCOUNTER — Encounter: Payer: Medicare PPO | Admitting: Dietician

## 2017-10-30 VITALS — BP 140/80 | Wt 252.8 lb

## 2017-10-30 DIAGNOSIS — E119 Type 2 diabetes mellitus without complications: Secondary | ICD-10-CM

## 2017-10-30 DIAGNOSIS — Z713 Dietary counseling and surveillance: Secondary | ICD-10-CM | POA: Diagnosis not present

## 2017-10-30 NOTE — Progress Notes (Signed)
Appt. Start Time: 9:00am Appt. End Time: 12:00  Class 3 Diabetes Overview - identify functions of pancreas and insulin; define insulin deficiency vs insulin resistance  Medications - state name, dose, timing of currently prescribed medications; describe types of medications available for diabetes  Psychosocial - identify DM as a source of stress; state the effects of stress on BG control; verbalize appropriate stress management techniques; identify personal stress issues   Nutritional Management - use food labels to identify serving size, content of carbohydrate, fiber, protein, fat, saturated fat and sodium; recognize food sources of fat, saturated fat, trans fat, and sodium, and verbalize goals for intake; describe healthful, appropriate food choices when dining out   Exercise - state a plan for personal exercise; verbalize contraindications for exercise  Self-Monitoring - state importance of SMBG; use SMBG results to effectively manage diabetes; identify importance of regular HbA1C testing and goals for results  Acute Complications - recognize hyperglycemia and hypoglycemia with causes and effects; identify blood glucose results as high, low or in control; list steps in treating and preventing high and low blood glucose  Chronic Complications - state importance of daily self-foot exams; explain appropriate eye and dental care  Lifestyle Changes/Goals & Health/Community Resources - set goals for proper diabetes care; state need for and frequency of healthcare follow-up; describe appropriate community resources for good health (ADA, web sites, apps)   Teaching Materials Used: Class 3 Slide Packet Diabetes Stress Test Stress Management Tools Stress Poem Goal Setting Worksheet Website/App List    

## 2017-11-03 ENCOUNTER — Encounter: Payer: Self-pay | Admitting: Dietician

## 2017-11-10 DIAGNOSIS — G4700 Insomnia, unspecified: Secondary | ICD-10-CM | POA: Diagnosis not present

## 2017-11-10 DIAGNOSIS — R2689 Other abnormalities of gait and mobility: Secondary | ICD-10-CM | POA: Diagnosis not present

## 2017-11-10 DIAGNOSIS — M6281 Muscle weakness (generalized): Secondary | ICD-10-CM | POA: Diagnosis not present

## 2017-11-10 DIAGNOSIS — R29898 Other symptoms and signs involving the musculoskeletal system: Secondary | ICD-10-CM | POA: Diagnosis not present

## 2017-12-11 DIAGNOSIS — R29898 Other symptoms and signs involving the musculoskeletal system: Secondary | ICD-10-CM | POA: Diagnosis not present

## 2017-12-11 DIAGNOSIS — G4700 Insomnia, unspecified: Secondary | ICD-10-CM | POA: Diagnosis not present

## 2017-12-11 DIAGNOSIS — R2689 Other abnormalities of gait and mobility: Secondary | ICD-10-CM | POA: Diagnosis not present

## 2017-12-11 DIAGNOSIS — M6281 Muscle weakness (generalized): Secondary | ICD-10-CM | POA: Diagnosis not present

## 2017-12-18 DIAGNOSIS — E119 Type 2 diabetes mellitus without complications: Secondary | ICD-10-CM | POA: Diagnosis not present

## 2017-12-18 DIAGNOSIS — R030 Elevated blood-pressure reading, without diagnosis of hypertension: Secondary | ICD-10-CM | POA: Diagnosis not present

## 2018-01-02 DIAGNOSIS — B9689 Other specified bacterial agents as the cause of diseases classified elsewhere: Secondary | ICD-10-CM | POA: Diagnosis not present

## 2018-01-02 DIAGNOSIS — J209 Acute bronchitis, unspecified: Secondary | ICD-10-CM | POA: Diagnosis not present

## 2018-01-02 DIAGNOSIS — J019 Acute sinusitis, unspecified: Secondary | ICD-10-CM | POA: Diagnosis not present

## 2018-01-08 DIAGNOSIS — R29898 Other symptoms and signs involving the musculoskeletal system: Secondary | ICD-10-CM | POA: Diagnosis not present

## 2018-01-08 DIAGNOSIS — M6281 Muscle weakness (generalized): Secondary | ICD-10-CM | POA: Diagnosis not present

## 2018-01-08 DIAGNOSIS — G4700 Insomnia, unspecified: Secondary | ICD-10-CM | POA: Diagnosis not present

## 2018-01-08 DIAGNOSIS — R2689 Other abnormalities of gait and mobility: Secondary | ICD-10-CM | POA: Diagnosis not present

## 2018-01-21 DIAGNOSIS — G4700 Insomnia, unspecified: Secondary | ICD-10-CM | POA: Diagnosis not present

## 2018-01-21 DIAGNOSIS — Z0000 Encounter for general adult medical examination without abnormal findings: Secondary | ICD-10-CM | POA: Diagnosis not present

## 2018-01-21 DIAGNOSIS — E119 Type 2 diabetes mellitus without complications: Secondary | ICD-10-CM | POA: Diagnosis not present

## 2018-01-21 DIAGNOSIS — I10 Essential (primary) hypertension: Secondary | ICD-10-CM | POA: Diagnosis not present

## 2018-02-08 DIAGNOSIS — M6281 Muscle weakness (generalized): Secondary | ICD-10-CM | POA: Diagnosis not present

## 2018-02-08 DIAGNOSIS — G4700 Insomnia, unspecified: Secondary | ICD-10-CM | POA: Diagnosis not present

## 2018-02-08 DIAGNOSIS — R2689 Other abnormalities of gait and mobility: Secondary | ICD-10-CM | POA: Diagnosis not present

## 2018-02-08 DIAGNOSIS — R29898 Other symptoms and signs involving the musculoskeletal system: Secondary | ICD-10-CM | POA: Diagnosis not present

## 2018-03-10 DIAGNOSIS — R2689 Other abnormalities of gait and mobility: Secondary | ICD-10-CM | POA: Diagnosis not present

## 2018-03-10 DIAGNOSIS — R29898 Other symptoms and signs involving the musculoskeletal system: Secondary | ICD-10-CM | POA: Diagnosis not present

## 2018-03-10 DIAGNOSIS — M6281 Muscle weakness (generalized): Secondary | ICD-10-CM | POA: Diagnosis not present

## 2018-03-10 DIAGNOSIS — G4700 Insomnia, unspecified: Secondary | ICD-10-CM | POA: Diagnosis not present

## 2018-03-16 DIAGNOSIS — L128 Other pemphigoid: Secondary | ICD-10-CM | POA: Diagnosis not present

## 2018-03-16 DIAGNOSIS — L821 Other seborrheic keratosis: Secondary | ICD-10-CM | POA: Diagnosis not present

## 2018-03-16 DIAGNOSIS — D692 Other nonthrombocytopenic purpura: Secondary | ICD-10-CM | POA: Diagnosis not present

## 2018-03-16 DIAGNOSIS — L820 Inflamed seborrheic keratosis: Secondary | ICD-10-CM | POA: Diagnosis not present

## 2018-04-10 DIAGNOSIS — R2689 Other abnormalities of gait and mobility: Secondary | ICD-10-CM | POA: Diagnosis not present

## 2018-04-10 DIAGNOSIS — M6281 Muscle weakness (generalized): Secondary | ICD-10-CM | POA: Diagnosis not present

## 2018-04-10 DIAGNOSIS — R29898 Other symptoms and signs involving the musculoskeletal system: Secondary | ICD-10-CM | POA: Diagnosis not present

## 2018-04-10 DIAGNOSIS — G4700 Insomnia, unspecified: Secondary | ICD-10-CM | POA: Diagnosis not present

## 2018-04-22 DIAGNOSIS — M542 Cervicalgia: Secondary | ICD-10-CM | POA: Diagnosis not present

## 2018-04-22 DIAGNOSIS — M5481 Occipital neuralgia: Secondary | ICD-10-CM | POA: Diagnosis not present

## 2018-05-10 DIAGNOSIS — M6281 Muscle weakness (generalized): Secondary | ICD-10-CM | POA: Diagnosis not present

## 2018-05-10 DIAGNOSIS — R29898 Other symptoms and signs involving the musculoskeletal system: Secondary | ICD-10-CM | POA: Diagnosis not present

## 2018-05-10 DIAGNOSIS — R2689 Other abnormalities of gait and mobility: Secondary | ICD-10-CM | POA: Diagnosis not present

## 2018-05-10 DIAGNOSIS — G4700 Insomnia, unspecified: Secondary | ICD-10-CM | POA: Diagnosis not present

## 2018-06-16 DIAGNOSIS — L120 Bullous pemphigoid: Secondary | ICD-10-CM | POA: Diagnosis not present

## 2018-06-29 DIAGNOSIS — M5481 Occipital neuralgia: Secondary | ICD-10-CM | POA: Diagnosis not present

## 2018-06-29 DIAGNOSIS — R51 Headache: Secondary | ICD-10-CM | POA: Diagnosis not present

## 2018-06-29 DIAGNOSIS — G250 Essential tremor: Secondary | ICD-10-CM | POA: Diagnosis not present

## 2018-06-29 DIAGNOSIS — G4733 Obstructive sleep apnea (adult) (pediatric): Secondary | ICD-10-CM | POA: Diagnosis not present

## 2018-06-29 DIAGNOSIS — R2689 Other abnormalities of gait and mobility: Secondary | ICD-10-CM | POA: Diagnosis not present

## 2018-07-02 ENCOUNTER — Other Ambulatory Visit: Payer: Self-pay | Admitting: Neurology

## 2018-07-02 DIAGNOSIS — R51 Headache: Principal | ICD-10-CM

## 2018-07-02 DIAGNOSIS — R519 Headache, unspecified: Secondary | ICD-10-CM

## 2018-07-20 ENCOUNTER — Ambulatory Visit: Payer: Medicare PPO

## 2018-07-23 DIAGNOSIS — E119 Type 2 diabetes mellitus without complications: Secondary | ICD-10-CM | POA: Diagnosis not present

## 2018-07-23 DIAGNOSIS — M5481 Occipital neuralgia: Secondary | ICD-10-CM | POA: Diagnosis not present

## 2018-07-23 DIAGNOSIS — R030 Elevated blood-pressure reading, without diagnosis of hypertension: Secondary | ICD-10-CM | POA: Diagnosis not present

## 2018-07-23 DIAGNOSIS — D649 Anemia, unspecified: Secondary | ICD-10-CM | POA: Diagnosis not present

## 2018-08-03 DIAGNOSIS — S0531XA Ocular laceration without prolapse or loss of intraocular tissue, right eye, initial encounter: Secondary | ICD-10-CM | POA: Diagnosis not present

## 2018-08-07 DIAGNOSIS — S0531XD Ocular laceration without prolapse or loss of intraocular tissue, right eye, subsequent encounter: Secondary | ICD-10-CM | POA: Diagnosis not present

## 2018-08-20 DIAGNOSIS — M25572 Pain in left ankle and joints of left foot: Secondary | ICD-10-CM | POA: Diagnosis not present

## 2018-08-20 DIAGNOSIS — M5481 Occipital neuralgia: Secondary | ICD-10-CM | POA: Diagnosis not present

## 2018-08-20 DIAGNOSIS — L120 Bullous pemphigoid: Secondary | ICD-10-CM | POA: Diagnosis not present

## 2018-08-20 DIAGNOSIS — M25571 Pain in right ankle and joints of right foot: Secondary | ICD-10-CM | POA: Diagnosis not present

## 2018-08-20 DIAGNOSIS — G8929 Other chronic pain: Secondary | ICD-10-CM | POA: Diagnosis not present

## 2018-08-20 DIAGNOSIS — M545 Low back pain: Secondary | ICD-10-CM | POA: Diagnosis not present

## 2018-09-15 DIAGNOSIS — L821 Other seborrheic keratosis: Secondary | ICD-10-CM | POA: Diagnosis not present

## 2018-09-15 DIAGNOSIS — L820 Inflamed seborrheic keratosis: Secondary | ICD-10-CM | POA: Diagnosis not present

## 2018-09-15 DIAGNOSIS — L720 Epidermal cyst: Secondary | ICD-10-CM | POA: Diagnosis not present

## 2018-09-15 DIAGNOSIS — L120 Bullous pemphigoid: Secondary | ICD-10-CM | POA: Diagnosis not present

## 2018-10-01 DIAGNOSIS — R5381 Other malaise: Secondary | ICD-10-CM | POA: Diagnosis not present

## 2018-11-24 ENCOUNTER — Ambulatory Visit: Payer: Medicare PPO

## 2019-05-14 DIAGNOSIS — R5381 Other malaise: Secondary | ICD-10-CM | POA: Diagnosis not present

## 2019-05-14 DIAGNOSIS — R2681 Unsteadiness on feet: Secondary | ICD-10-CM | POA: Diagnosis not present

## 2019-05-14 DIAGNOSIS — M79604 Pain in right leg: Secondary | ICD-10-CM | POA: Diagnosis not present

## 2019-05-14 DIAGNOSIS — M25471 Effusion, right ankle: Secondary | ICD-10-CM | POA: Diagnosis not present

## 2019-05-14 DIAGNOSIS — M25472 Effusion, left ankle: Secondary | ICD-10-CM | POA: Diagnosis not present

## 2019-05-14 DIAGNOSIS — M545 Low back pain: Secondary | ICD-10-CM | POA: Diagnosis not present

## 2019-05-14 DIAGNOSIS — M79605 Pain in left leg: Secondary | ICD-10-CM | POA: Diagnosis not present

## 2019-05-14 DIAGNOSIS — M25571 Pain in right ankle and joints of right foot: Secondary | ICD-10-CM | POA: Diagnosis not present

## 2019-05-14 DIAGNOSIS — G8929 Other chronic pain: Secondary | ICD-10-CM | POA: Diagnosis not present

## 2019-07-20 DIAGNOSIS — Z79899 Other long term (current) drug therapy: Secondary | ICD-10-CM | POA: Diagnosis not present

## 2019-07-20 DIAGNOSIS — D649 Anemia, unspecified: Secondary | ICD-10-CM | POA: Diagnosis not present

## 2019-07-20 DIAGNOSIS — E538 Deficiency of other specified B group vitamins: Secondary | ICD-10-CM | POA: Diagnosis not present

## 2019-07-20 DIAGNOSIS — E119 Type 2 diabetes mellitus without complications: Secondary | ICD-10-CM | POA: Diagnosis not present

## 2019-07-27 DIAGNOSIS — D519 Vitamin B12 deficiency anemia, unspecified: Secondary | ICD-10-CM | POA: Diagnosis not present

## 2019-07-27 DIAGNOSIS — Z23 Encounter for immunization: Secondary | ICD-10-CM | POA: Diagnosis not present

## 2019-07-27 DIAGNOSIS — Z0000 Encounter for general adult medical examination without abnormal findings: Secondary | ICD-10-CM | POA: Diagnosis not present

## 2019-07-27 DIAGNOSIS — E119 Type 2 diabetes mellitus without complications: Secondary | ICD-10-CM | POA: Diagnosis not present

## 2019-07-27 DIAGNOSIS — Z87891 Personal history of nicotine dependence: Secondary | ICD-10-CM | POA: Diagnosis not present

## 2019-07-27 DIAGNOSIS — G4700 Insomnia, unspecified: Secondary | ICD-10-CM | POA: Diagnosis not present

## 2019-07-31 DIAGNOSIS — M79604 Pain in right leg: Secondary | ICD-10-CM | POA: Diagnosis not present

## 2019-07-31 DIAGNOSIS — G4730 Sleep apnea, unspecified: Secondary | ICD-10-CM | POA: Diagnosis not present

## 2019-07-31 DIAGNOSIS — L120 Bullous pemphigoid: Secondary | ICD-10-CM | POA: Diagnosis not present

## 2019-07-31 DIAGNOSIS — E669 Obesity, unspecified: Secondary | ICD-10-CM | POA: Diagnosis not present

## 2019-07-31 DIAGNOSIS — M79605 Pain in left leg: Secondary | ICD-10-CM | POA: Diagnosis not present

## 2019-07-31 DIAGNOSIS — E119 Type 2 diabetes mellitus without complications: Secondary | ICD-10-CM | POA: Diagnosis not present

## 2019-07-31 DIAGNOSIS — I10 Essential (primary) hypertension: Secondary | ICD-10-CM | POA: Diagnosis not present

## 2019-07-31 DIAGNOSIS — D519 Vitamin B12 deficiency anemia, unspecified: Secondary | ICD-10-CM | POA: Diagnosis not present

## 2019-07-31 DIAGNOSIS — Z79891 Long term (current) use of opiate analgesic: Secondary | ICD-10-CM | POA: Diagnosis not present

## 2019-08-07 DIAGNOSIS — D519 Vitamin B12 deficiency anemia, unspecified: Secondary | ICD-10-CM | POA: Diagnosis not present

## 2019-08-07 DIAGNOSIS — M79605 Pain in left leg: Secondary | ICD-10-CM | POA: Diagnosis not present

## 2019-08-07 DIAGNOSIS — E119 Type 2 diabetes mellitus without complications: Secondary | ICD-10-CM | POA: Diagnosis not present

## 2019-08-07 DIAGNOSIS — Z79891 Long term (current) use of opiate analgesic: Secondary | ICD-10-CM | POA: Diagnosis not present

## 2019-08-07 DIAGNOSIS — E669 Obesity, unspecified: Secondary | ICD-10-CM | POA: Diagnosis not present

## 2019-08-07 DIAGNOSIS — M79604 Pain in right leg: Secondary | ICD-10-CM | POA: Diagnosis not present

## 2019-08-07 DIAGNOSIS — L120 Bullous pemphigoid: Secondary | ICD-10-CM | POA: Diagnosis not present

## 2019-08-07 DIAGNOSIS — G4730 Sleep apnea, unspecified: Secondary | ICD-10-CM | POA: Diagnosis not present

## 2019-08-07 DIAGNOSIS — I10 Essential (primary) hypertension: Secondary | ICD-10-CM | POA: Diagnosis not present

## 2019-08-11 DIAGNOSIS — E669 Obesity, unspecified: Secondary | ICD-10-CM | POA: Diagnosis not present

## 2019-08-11 DIAGNOSIS — M79604 Pain in right leg: Secondary | ICD-10-CM | POA: Diagnosis not present

## 2019-08-11 DIAGNOSIS — E119 Type 2 diabetes mellitus without complications: Secondary | ICD-10-CM | POA: Diagnosis not present

## 2019-08-11 DIAGNOSIS — G4730 Sleep apnea, unspecified: Secondary | ICD-10-CM | POA: Diagnosis not present

## 2019-08-11 DIAGNOSIS — Z79891 Long term (current) use of opiate analgesic: Secondary | ICD-10-CM | POA: Diagnosis not present

## 2019-08-11 DIAGNOSIS — L120 Bullous pemphigoid: Secondary | ICD-10-CM | POA: Diagnosis not present

## 2019-08-11 DIAGNOSIS — M79605 Pain in left leg: Secondary | ICD-10-CM | POA: Diagnosis not present

## 2019-08-11 DIAGNOSIS — I10 Essential (primary) hypertension: Secondary | ICD-10-CM | POA: Diagnosis not present

## 2019-08-11 DIAGNOSIS — D519 Vitamin B12 deficiency anemia, unspecified: Secondary | ICD-10-CM | POA: Diagnosis not present

## 2019-08-16 DIAGNOSIS — Z79891 Long term (current) use of opiate analgesic: Secondary | ICD-10-CM | POA: Diagnosis not present

## 2019-08-16 DIAGNOSIS — D519 Vitamin B12 deficiency anemia, unspecified: Secondary | ICD-10-CM | POA: Diagnosis not present

## 2019-08-16 DIAGNOSIS — M79605 Pain in left leg: Secondary | ICD-10-CM | POA: Diagnosis not present

## 2019-08-16 DIAGNOSIS — E119 Type 2 diabetes mellitus without complications: Secondary | ICD-10-CM | POA: Diagnosis not present

## 2019-08-16 DIAGNOSIS — G4730 Sleep apnea, unspecified: Secondary | ICD-10-CM | POA: Diagnosis not present

## 2019-08-16 DIAGNOSIS — E669 Obesity, unspecified: Secondary | ICD-10-CM | POA: Diagnosis not present

## 2019-08-16 DIAGNOSIS — L120 Bullous pemphigoid: Secondary | ICD-10-CM | POA: Diagnosis not present

## 2019-08-16 DIAGNOSIS — I10 Essential (primary) hypertension: Secondary | ICD-10-CM | POA: Diagnosis not present

## 2019-08-16 DIAGNOSIS — M79604 Pain in right leg: Secondary | ICD-10-CM | POA: Diagnosis not present

## 2019-08-20 DIAGNOSIS — E119 Type 2 diabetes mellitus without complications: Secondary | ICD-10-CM | POA: Diagnosis not present

## 2019-08-20 DIAGNOSIS — L120 Bullous pemphigoid: Secondary | ICD-10-CM | POA: Diagnosis not present

## 2019-08-20 DIAGNOSIS — E669 Obesity, unspecified: Secondary | ICD-10-CM | POA: Diagnosis not present

## 2019-08-20 DIAGNOSIS — M79604 Pain in right leg: Secondary | ICD-10-CM | POA: Diagnosis not present

## 2019-08-20 DIAGNOSIS — I10 Essential (primary) hypertension: Secondary | ICD-10-CM | POA: Diagnosis not present

## 2019-08-20 DIAGNOSIS — M79605 Pain in left leg: Secondary | ICD-10-CM | POA: Diagnosis not present

## 2019-08-20 DIAGNOSIS — D519 Vitamin B12 deficiency anemia, unspecified: Secondary | ICD-10-CM | POA: Diagnosis not present

## 2019-08-20 DIAGNOSIS — G4730 Sleep apnea, unspecified: Secondary | ICD-10-CM | POA: Diagnosis not present

## 2019-08-20 DIAGNOSIS — Z79891 Long term (current) use of opiate analgesic: Secondary | ICD-10-CM | POA: Diagnosis not present

## 2019-08-27 DIAGNOSIS — M79605 Pain in left leg: Secondary | ICD-10-CM | POA: Diagnosis not present

## 2019-08-27 DIAGNOSIS — Z79891 Long term (current) use of opiate analgesic: Secondary | ICD-10-CM | POA: Diagnosis not present

## 2019-08-27 DIAGNOSIS — L120 Bullous pemphigoid: Secondary | ICD-10-CM | POA: Diagnosis not present

## 2019-08-27 DIAGNOSIS — D519 Vitamin B12 deficiency anemia, unspecified: Secondary | ICD-10-CM | POA: Diagnosis not present

## 2019-08-27 DIAGNOSIS — E119 Type 2 diabetes mellitus without complications: Secondary | ICD-10-CM | POA: Diagnosis not present

## 2019-08-27 DIAGNOSIS — M79604 Pain in right leg: Secondary | ICD-10-CM | POA: Diagnosis not present

## 2019-08-27 DIAGNOSIS — E669 Obesity, unspecified: Secondary | ICD-10-CM | POA: Diagnosis not present

## 2019-08-27 DIAGNOSIS — I10 Essential (primary) hypertension: Secondary | ICD-10-CM | POA: Diagnosis not present

## 2019-08-27 DIAGNOSIS — G4730 Sleep apnea, unspecified: Secondary | ICD-10-CM | POA: Diagnosis not present

## 2019-08-30 DIAGNOSIS — I10 Essential (primary) hypertension: Secondary | ICD-10-CM | POA: Diagnosis not present

## 2019-08-30 DIAGNOSIS — Z79891 Long term (current) use of opiate analgesic: Secondary | ICD-10-CM | POA: Diagnosis not present

## 2019-08-30 DIAGNOSIS — E119 Type 2 diabetes mellitus without complications: Secondary | ICD-10-CM | POA: Diagnosis not present

## 2019-08-30 DIAGNOSIS — M79604 Pain in right leg: Secondary | ICD-10-CM | POA: Diagnosis not present

## 2019-08-30 DIAGNOSIS — L120 Bullous pemphigoid: Secondary | ICD-10-CM | POA: Diagnosis not present

## 2019-08-30 DIAGNOSIS — M79605 Pain in left leg: Secondary | ICD-10-CM | POA: Diagnosis not present

## 2019-08-30 DIAGNOSIS — D519 Vitamin B12 deficiency anemia, unspecified: Secondary | ICD-10-CM | POA: Diagnosis not present

## 2019-08-30 DIAGNOSIS — E669 Obesity, unspecified: Secondary | ICD-10-CM | POA: Diagnosis not present

## 2019-08-30 DIAGNOSIS — G4730 Sleep apnea, unspecified: Secondary | ICD-10-CM | POA: Diagnosis not present

## 2019-09-06 ENCOUNTER — Emergency Department: Payer: Medicare HMO

## 2019-09-06 ENCOUNTER — Emergency Department
Admission: EM | Admit: 2019-09-06 | Discharge: 2019-09-06 | Disposition: A | Discharge status: Other Acute Inpatient Hospital | Payer: Medicare HMO | Attending: Emergency Medicine | Admitting: Emergency Medicine

## 2019-09-06 ENCOUNTER — Encounter: Payer: Self-pay | Admitting: Emergency Medicine

## 2019-09-06 DIAGNOSIS — I499 Cardiac arrhythmia, unspecified: Secondary | ICD-10-CM | POA: Diagnosis not present

## 2019-09-06 DIAGNOSIS — I4510 Unspecified right bundle-branch block: Secondary | ICD-10-CM | POA: Diagnosis not present

## 2019-09-06 DIAGNOSIS — R918 Other nonspecific abnormal finding of lung field: Secondary | ICD-10-CM | POA: Diagnosis not present

## 2019-09-06 DIAGNOSIS — R0600 Dyspnea, unspecified: Secondary | ICD-10-CM | POA: Diagnosis present

## 2019-09-06 DIAGNOSIS — I4891 Unspecified atrial fibrillation: Secondary | ICD-10-CM

## 2019-09-06 DIAGNOSIS — J189 Pneumonia, unspecified organism: Secondary | ICD-10-CM | POA: Diagnosis not present

## 2019-09-06 DIAGNOSIS — Z96659 Presence of unspecified artificial knee joint: Secondary | ICD-10-CM | POA: Diagnosis not present

## 2019-09-06 DIAGNOSIS — Z87891 Personal history of nicotine dependence: Secondary | ICD-10-CM | POA: Diagnosis not present

## 2019-09-06 DIAGNOSIS — R52 Pain, unspecified: Secondary | ICD-10-CM | POA: Diagnosis not present

## 2019-09-06 DIAGNOSIS — R404 Transient alteration of awareness: Secondary | ICD-10-CM | POA: Diagnosis not present

## 2019-09-06 DIAGNOSIS — N179 Acute kidney failure, unspecified: Secondary | ICD-10-CM | POA: Diagnosis not present

## 2019-09-06 DIAGNOSIS — U071 COVID-19: Secondary | ICD-10-CM

## 2019-09-06 DIAGNOSIS — R0902 Hypoxemia: Secondary | ICD-10-CM

## 2019-09-06 LAB — CBC WITH DIFFERENTIAL/PLATELET
Abs Immature Granulocytes: 0.06 10*3/uL (ref 0.00–0.07)
Basophils Absolute: 0 10*3/uL (ref 0.0–0.1)
Basophils Relative: 0 %
Eosinophils Absolute: 0 10*3/uL (ref 0.0–0.5)
Eosinophils Relative: 0 %
HCT: 32.1 % — ABNORMAL LOW (ref 39.0–52.0)
Hemoglobin: 10.5 g/dL — ABNORMAL LOW (ref 13.0–17.0)
Immature Granulocytes: 1 %
Lymphocytes Relative: 12 %
Lymphs Abs: 0.9 10*3/uL (ref 0.7–4.0)
MCH: 34.2 pg — ABNORMAL HIGH (ref 26.0–34.0)
MCHC: 32.7 g/dL (ref 30.0–36.0)
MCV: 104.6 fL — ABNORMAL HIGH (ref 80.0–100.0)
Monocytes Absolute: 0.5 10*3/uL (ref 0.1–1.0)
Monocytes Relative: 6 %
Neutro Abs: 6.1 10*3/uL (ref 1.7–7.7)
Neutrophils Relative %: 81 %
Platelets: 82 10*3/uL — ABNORMAL LOW (ref 150–400)
RBC: 3.07 MIL/uL — ABNORMAL LOW (ref 4.22–5.81)
RDW: 14.6 % (ref 11.5–15.5)
WBC: 7.6 10*3/uL (ref 4.0–10.5)
nRBC: 0 % (ref 0.0–0.2)

## 2019-09-06 LAB — URINALYSIS, ROUTINE W REFLEX MICROSCOPIC
Bilirubin Urine: NEGATIVE
Glucose, UA: NEGATIVE mg/dL
Ketones, ur: NEGATIVE mg/dL
Leukocytes,Ua: NEGATIVE
Nitrite: NEGATIVE
Protein, ur: NEGATIVE mg/dL
Specific Gravity, Urine: 1.018 (ref 1.005–1.030)
pH: 5 (ref 5.0–8.0)

## 2019-09-06 LAB — COMPREHENSIVE METABOLIC PANEL
ALT: 20 U/L (ref 0–44)
AST: 25 U/L (ref 15–41)
Albumin: 3.6 g/dL (ref 3.5–5.0)
Alkaline Phosphatase: 42 U/L (ref 38–126)
Anion gap: 12 (ref 5–15)
BUN: 43 mg/dL — ABNORMAL HIGH (ref 8–23)
CO2: 24 mmol/L (ref 22–32)
Calcium: 8.8 mg/dL — ABNORMAL LOW (ref 8.9–10.3)
Chloride: 106 mmol/L (ref 98–111)
Creatinine, Ser: 1.68 mg/dL — ABNORMAL HIGH (ref 0.61–1.24)
GFR calc Af Amer: 41 mL/min — ABNORMAL LOW (ref >60)
GFR calc non Af Amer: 35 mL/min — ABNORMAL LOW (ref >60)
Glucose, Bld: 152 mg/dL — ABNORMAL HIGH (ref 70–99)
Potassium: 4.5 mmol/L (ref 3.5–5.1)
Sodium: 142 mmol/L (ref 135–145)
Total Bilirubin: 0.9 mg/dL (ref 0.3–1.2)
Total Protein: 6 g/dL — ABNORMAL LOW (ref 6.5–8.1)

## 2019-09-06 LAB — BLOOD GAS, VENOUS
Acid-Base Excess: 1.6 mmol/L (ref 0.0–2.0)
Bicarbonate: 28.9 mmol/L — ABNORMAL HIGH (ref 20.0–28.0)
O2 Saturation: 29.4 %
Patient temperature: 37
pCO2, Ven: 56 mmHg (ref 44.0–60.0)
pH, Ven: 7.32 (ref 7.250–7.430)

## 2019-09-06 LAB — POC SARS CORONAVIRUS 2 AG: SARS Coronavirus 2 Ag: POSITIVE — AB

## 2019-09-06 LAB — PROTIME-INR
INR: 1 (ref 0.8–1.2)
Prothrombin Time: 13.1 seconds (ref 11.4–15.2)

## 2019-09-06 LAB — LACTIC ACID, PLASMA
Lactic Acid, Venous: 2 mmol/L (ref 0.5–1.9)
Lactic Acid, Venous: 1.8 mmol/L (ref 0.5–1.9)

## 2019-09-06 LAB — SARS CORONAVIRUS 2 BY RT PCR (HOSPITAL ORDER, PERFORMED IN ~~LOC~~ HOSPITAL LAB): SARS Coronavirus 2: POSITIVE — AB

## 2019-09-06 LAB — TROPONIN I (HIGH SENSITIVITY)
Troponin I (High Sensitivity): 81 ng/L — ABNORMAL HIGH (ref <18)
Troponin I (High Sensitivity): 97 ng/L — ABNORMAL HIGH (ref <18)

## 2019-09-06 MED ORDER — MIRTAZAPINE 15 MG PO TABS: 15.0000 mg | ORAL_TABLET | Freq: Every day | ORAL | Status: DC

## 2019-09-06 MED ORDER — SODIUM CHLORIDE 0.9 % IV SOLN
Freq: Once | INTRAVENOUS | Status: AC
  Administered 2019-09-06: 10:00:00 via INTRAVENOUS

## 2019-09-06 MED ORDER — HEPARIN BOLUS VIA INFUSION
5000.0000 [IU] | Freq: Once | INTRAVENOUS | Status: AC
  Administered 2019-09-06: 21:00:00 5000 [IU] via INTRAVENOUS
  Filled 2019-09-06: qty 5000

## 2019-09-06 MED ORDER — SODIUM CHLORIDE 0.9 % IV BOLUS
500.0000 mL | Freq: Once | INTRAVENOUS | Status: AC
  Administered 2019-09-06: 17:00:00 500 mL via INTRAVENOUS

## 2019-09-06 MED ORDER — SODIUM CHLORIDE 0.9 % IV SOLN: 100.0000 mg | INTRAVENOUS | Status: DC

## 2019-09-06 MED ORDER — ACETAMINOPHEN 325 MG PO TABS
650.0000 mg | ORAL_TABLET | Freq: Once | ORAL | Status: AC
  Administered 2019-09-06: 650 mg via ORAL
  Filled 2019-09-06: qty 2

## 2019-09-06 MED ORDER — PIPERACILLIN-TAZOBACTAM 3.375 G IVPB 30 MIN
3.3750 g | Freq: Once | INTRAVENOUS | Status: AC
  Administered 2019-09-06: 10:00:00 3.375 g via INTRAVENOUS
  Filled 2019-09-06: qty 50

## 2019-09-06 MED ORDER — TRAZODONE HCL 100 MG PO TABS
200.0000 mg | ORAL_TABLET | Freq: Every day | ORAL | Status: DC
  Administered 2019-09-06: 200 mg via ORAL
  Filled 2019-09-06: qty 2

## 2019-09-06 MED ORDER — TRAZODONE HCL 100 MG PO TABS: 200.0000 mg | ORAL_TABLET | Freq: Every day | ORAL | Status: DC

## 2019-09-06 MED ORDER — DILTIAZEM HCL 25 MG/5ML IV SOLN
5.0000 mg | Freq: Once | INTRAVENOUS | Status: AC
  Administered 2019-09-06: 11:00:00 5 mg via INTRAVENOUS
  Filled 2019-09-06: qty 5

## 2019-09-06 MED ORDER — SODIUM CHLORIDE 0.9 % IV SOLN
2.0000 g | INTRAVENOUS | Status: DC
  Administered 2019-09-06: 20:00:00 2 g via INTRAVENOUS
  Filled 2019-09-06: qty 20

## 2019-09-06 MED ORDER — VANCOMYCIN HCL IN DEXTROSE 1-5 GM/200ML-% IV SOLN
1000.0000 mg | Freq: Once | INTRAVENOUS | Status: AC
  Administered 2019-09-06: 1000 mg via INTRAVENOUS
  Filled 2019-09-06: qty 200

## 2019-09-06 MED ORDER — SODIUM CHLORIDE 0.9 % IV SOLN
500.0000 mg | INTRAVENOUS | Status: DC
  Administered 2019-09-06: 500 mg via INTRAVENOUS
  Filled 2019-09-06: qty 500

## 2019-09-06 MED ORDER — SODIUM CHLORIDE 0.9 % IV SOLN
200.0000 mg | Freq: Once | INTRAVENOUS | Status: AC
  Administered 2019-09-06: 200 mg via INTRAVENOUS
  Filled 2019-09-06: qty 40

## 2019-09-06 MED ORDER — DILTIAZEM HCL 100 MG IV SOLR
5.0000 mg/h | INTRAVENOUS | Status: DC
  Filled 2019-09-06 (×2): qty 100

## 2019-09-06 MED ORDER — HEPARIN (PORCINE) 25000 UT/250ML-% IV SOLN
1400.0000 [IU]/h | INTRAVENOUS | Status: DC
  Filled 2019-09-06: qty 250

## 2019-09-06 NOTE — Consult Note (Signed)
ANTICOAGULATION CONSULT NOTE - Initial Consult  Pharmacy Consult for Heparin Infusion Indication: atrial fibrillation  Allergies  Allergen Reactions  . Aspirin     Patient Measurements: Height: 5\' 10"  (177.8 cm) Weight: 260 lb 2.3 oz (118 kg) IBW/kg (Calculated) : 73 Heparin Dosing Weight: 99.3 kg  Vital Signs: Temp: 99.1 F (37.3 C) (11/23 1425) Temp Source: Oral (11/23 1425) BP: 116/52 (11/23 1830) Pulse Rate: 103 (11/23 1830)  Labs: Recent Labs    09/06/19 0922 09/06/19 0934 09/06/19 1040 09/06/19 1115  HGB  --   --  10.5*  --   HCT  --   --  32.1*  --   PLT  --   --  82*  --   LABPROT  --  13.1  --   --   INR  --  1.0  --   --   CREATININE 1.68*  --   --   --   TROPONINIHS 81*  --   --  97*    Estimated Creatinine Clearance: 36.9 mL/min (A) (by C-G formula based on SCr of 1.68 mg/dL (H)).   Medical History: Past Medical History:  Diagnosis Date  . Allergy   . Arthritis   . Sleep apnea     Medications:  (Not in a hospital admission)  Scheduled:  . heparin  5,000 Units Intravenous Once  . mirtazapine  15 mg Oral QHS  . traZODone  200 mg Oral QHS   Infusions:  . azithromycin (ZITHROMAX) 500 MG IVPB (Vial-Mate [employer])    . cefTRIAXone (ROCEPHIN)  IV    . diltiazem (CARDIZEM) infusion 5 mg/hr (09/06/19 1418)  . heparin    . remdesivir 200 mg in NS 250 mL     Followed by  . [START ON Sep 20, 2019] remdesivir 100 mg in NS 250 mL     PRN:  Anti-infectives (From admission, onward)   Start     Dose/Rate Route Frequency Ordered Stop   Sep 20, 2019 2000  remdesivir 100 mg in sodium chloride 0.9 % 250 mL IVPB     100 mg 500 mL/hr over 30 Minutes Intravenous Every 24 hours 09/06/19 1906 09/11/19 1959   09/06/19 2000  remdesivir 200 mg in sodium chloride 0.9 % 250 mL IVPB     200 mg 500 mL/hr over 30 Minutes Intravenous Once 09/06/19 1906     09/06/19 1900  cefTRIAXone (ROCEPHIN) 2 g in sodium chloride 0.9 % 100 mL IVPB     2 g 200 mL/hr over 30 Minutes  Intravenous Every 24 hours 09/06/19 1852     09/06/19 1900  azithromycin (ZITHROMAX) 500 mg in sodium chloride 0.9 % 250 mL IVPB     500 mg 250 mL/hr over 60 Minutes Intravenous Every 24 hours 09/06/19 1852     09/06/19 0945  vancomycin (VANCOCIN) IVPB 1000 mg/200 mL premix     1,000 mg 200 mL/hr over 60 Minutes Intravenous  Once 09/06/19 0930 09/06/19 1414   09/06/19 0945  piperacillin-tazobactam (ZOSYN) IVPB 3.375 g     3.375 g 100 mL/hr over 30 Minutes Intravenous  Once 09/06/19 0930 09/06/19 1050      Assessment: Pharmacy has been consulted to initiate Heparin Infusion in 85yo patient with new onset Atrial Fibrillation. Patient is also COVID+ and arrived to ED tachypneic and noted to be hypoxic on room air. Was initially accepted to [facility] but due to shortage of staff and beds, will remain inpatient at [facility] currently. Patient has no history of PTA anticoagulant  use. Baseline labs have been ordered and are unremarkable. Will initiate infusion immediately.  Goal of Therapy:  Heparin level 0.3-0.7 units/ml Monitor platelets by anticoagulation protocol: Yes   Plan:  Give 5000 units bolus x 1 Start heparin infusion at 1400 units/hr Check anti-Xa level in 8 hours and daily while on heparin Continue to monitor H&H and platelets  Shermeka Rutt A Leeanna Slaby 09/06/2019,7:17 PM

## 2019-09-06 NOTE — ED Provider Notes (Signed)
Patient awaiting bed at [facility]. COVID+ with new onset AFib RVR with +troponin. Will add Remdesevir, in addition to heparin (not lovenox 2/2 AKI) for elevated trop, AFib RVR, COVID+ while awaiting bed.   Duffy Bruce, MD 09/06/19 [phone]

## 2019-09-06 NOTE — ED Notes (Signed)
Pt pulling at lines and nasal cannula and stating that he "has to get out of here and wants to move around the room". Pt informed that he is too weak to ambulate and that he must remain on the monitor and O2 in order to get better.

## 2019-09-06 NOTE — ED Provider Notes (Signed)
[facility] Emergency Department Provider Note       Time seen: ----------------------------------------- 9:22 AM on 09/06/2019 -----------------------------------------  Level V caveat: History/ROS limited by altered mental status I have reviewed the triage vital signs and the nursing notes.  HISTORY   Chief Complaint No chief complaint on file.    HPI Todd Wall is a 85 y.o. male with a history of allergies, arthritis, sleep apnea who presents to the ED for respiratory distress and possible sepsis.  Patient arrives altered, was tachypneic and noted to be hypoxic on room air.  He states he has been short of breath before, unclear how long this is been going on.  He was given nasal cannula oxygen with good oxygen saturations.  He has not had any known Covid exposure  Past Medical History:  Diagnosis Date  . Allergy   . Arthritis   . Sleep apnea     Patient Active Problem List   Diagnosis Date Noted  . BP (bullous pemphigoid) 09/28/2013    Past Surgical History:  Procedure Laterality Date  . REPLACEMENT TOTAL KNEE      Allergies Aspirin  Social History Social History   Tobacco Use  . Smoking status: Former Research scientist (life sciences)  . Smokeless tobacco: Never Used  . Tobacco comment: quit  Substance Use Topics  . Alcohol use: No    Frequency: Never  . Drug use: Not on file   Review of Systems Unknown, patient is altered mental status, reported shortness of breath and tachypnea  All systems negative/normal/unremarkable except as stated in the HPI  ____________________________________________   PHYSICAL EXAM:  VITAL SIGNS: ED Triage Vitals  Enc Vitals Group     BP      Pulse      Resp      Temp      Temp src      SpO2      Weight      Height      Head Circumference      Peak Flow      Pain Score      Pain Loc      Pain Edu?      Excl. in [place]?    Constitutional: Alert but disoriented.  Mild to moderate distress Eyes: Conjunctivae  are normal. Normal extraocular movements. ENT      Head: Normocephalic and atraumatic.      Nose: No congestion/rhinnorhea.      Mouth/Throat: Mucous membranes are moist.      Neck: No stridor. Cardiovascular: Normal rate, regular rhythm. No murmurs, rubs, or gallops. Respiratory: Tachypnea with mild wheezing Gastrointestinal: Soft and nontender. Normal bowel sounds Musculoskeletal: Nontender with normal range of motion in extremities.  Pitting edema to his ankles Neurologic:  Normal speech and language. No gross focal neurologic deficits are appreciated.  Generalized weakness, nothing focal Skin:  Skin is warm, dry and intact. No rash noted. Psychiatric: Mood and affect are normal.  ____________________________________________  EKG: Interpreted by me.  Atrial fibrillation with a rate of 129 bpm, right bundle branch block, left anterior fascicular block, normal QT  Repeat EKG interpreted by me, atrial fibrillation with a rate of 118 bpm, PVCs, right bundle branch block, left anterior fascicular block  ____________________________________________  ED COURSE:  As part of my medical decision making, I reviewed the following data within the [place] History obtained from family if available, nursing notes, old chart and ekg, as well as notes from prior ED visits. Patient  presented for altered mental status and dyspnea, we will assess with labs and imaging as indicated at this time.   Procedures  Todd Wall was evaluated in Emergency Department on 09/06/2019 for the symptoms described in the history of present illness. He was evaluated in the context of the global COVID-19 pandemic, which necessitated consideration that the patient might be at risk for infection with the SARS-CoV-2 virus that causes COVID-19. Institutional protocols and algorithms that pertain to the evaluation of patients at risk for COVID-19 are in a state of rapid change based on information released  by regulatory bodies including the CDC and federal and state organizations. These policies and algorithms were followed during the patient's care in the ED.  ____________________________________________   LABS (pertinent positives/negatives)  Labs Reviewed  LACTIC ACID, PLASMA - Abnormal; Notable for the following components:      Result Value   Lactic Acid, Venous 2.0 (*)    All other components within normal limits  COMPREHENSIVE METABOLIC PANEL - Abnormal; Notable for the following components:   Glucose, Bld 152 (*)    BUN 43 (*)    Creatinine, Ser 1.68 (*)    Calcium 8.8 (*)    Total Protein 6.0 (*)    GFR calc non Af Amer 35 (*)    GFR calc Af Amer 41 (*)    All other components within normal limits  BLOOD GAS, VENOUS - Abnormal; Notable for the following components:   Bicarbonate 28.9 (*)    All other components within normal limits  TROPONIN I (HIGH SENSITIVITY) - Abnormal; Notable for the following components:   Troponin I (High Sensitivity) 81 (*)    All other components within normal limits  CULTURE, BLOOD (ROUTINE X 2)  CULTURE, BLOOD (ROUTINE X 2)  URINE CULTURE  PROTIME-INR  LACTIC ACID, PLASMA  CBC WITH DIFFERENTIAL/PLATELET  URINALYSIS, ROUTINE W REFLEX MICROSCOPIC  CBC WITH DIFFERENTIAL/PLATELET  CBC WITH DIFFERENTIAL/PLATELET  POC SARS CORONAVIRUS 2 AG -  ED  TROPONIN I (HIGH SENSITIVITY)   CRITICAL CARE Performed by: Ulice DashJohnathan E Priest Lockridge   Total critical care time: 30 minutes  Critical care time was exclusive of separately billable procedures and treating other patients.  Critical care was necessary to treat or prevent imminent or life-threatening deterioration.  Critical care was time spent personally by me on the following activities: development of treatment plan with patient and/or surrogate as well as nursing, discussions with consultants, evaluation of patient's response to treatment, examination of patient, obtaining history from patient or  surrogate, ordering and performing treatments and interventions, ordering and review of laboratory studies, ordering and review of radiographic studies, pulse oximetry and re-evaluation of patient's condition.  RADIOLOGY Images were viewed by me  Chest x-ray IMPRESSION:  Opacities within the left mid lung and left lung base suspicious for  pneumonia given provided history. Left basilar atelectasis and  effusion also likely present.   More subtle opacity at the right lung base, which may reflect  incomplete atelectasis or a disc site of infection.   Cardiomegaly.   Aortic atherosclerosis.  ____________________________________________   DIFFERENTIAL DIAGNOSIS   CHF, COPD, pneumonia, COVID-19, PE, pneumothorax, sepsis, hypercarbic respiratory failure  FINAL ASSESSMENT AND PLAN  Altered mental status, hypoxia, community-acquired pneumonia, acute kidney injury, new onset atrial fibrillation   Plan: The patient had presented for altered mental status, tachypnea and hypoxia.  Code sepsis protocols were initiated.  Patient's labs revealed acute kidney injury, reasonable blood gas, mild lactic acid elevation and mildly elevated  troponin. Patient's imaging did reveal opacities particularly on the left side concerning for community-acquired pneumonia.  He was given broad-spectrum antibiotics and IV fluids.  He also received IV Cardizem for rate control for new onset A. fib.  He will continued inpatient course with fluids, antibiotics, perhaps Cardizem drip.   Ulice Dash, MD    Note: This note was generated in part or whole with voice recognition software. Voice recognition is usually quite accurate but there are transcription errors that can and very often do occur. I apologize for any typographical errors that were not detected and corrected.     Emily Filbert, MD 09/06/19 [phone]

## 2019-09-06 NOTE — ED Notes (Addendum)
Pt reswabbed for covid. Specimen ambulated to lab.

## 2019-09-06 NOTE — ED Notes (Signed)
E-signature not working at this time. Pt verbalized consent for transfer and understands benefits and risks. Wife also verbalized consent at this time.

## 2019-09-06 NOTE — ED Triage Notes (Signed)
Pt to ED by [facility] from home. Per pt's wife pt has increasing weakness over the last few days. Pt also confused. Per EMS pt close to meeting sepsis protocol.

## 2019-09-06 NOTE — ED Provider Notes (Signed)
Patient has been accepted to the [facility] for transfer.   Fue Cervenka E, MD 09/06/19 1349  

## 2019-09-06 NOTE — Consult Note (Signed)
Pharmacy Antibiotic Note  Todd Wall is a 85 y.o. male admitted on 09/06/2019 with COVID19 virus.  Pharmacy has been consulted for Remdesivir dosing. Patient was initially scheduled to be transferred to [facility] but due to shortage of staff and beds, will remain inpatient at [facility].   Plan: Remdesivir 200mg  IV x 1 dose, followed by 100mg  IV daily x 4 doses  Height: 5\' 10"  (177.8 cm) Weight: 260 lb 2.3 oz (118 kg) IBW/kg (Calculated) : 73  Temp (24hrs), Avg:100 F (37.8 C), Min:99.1 F (37.3 C), Max:100.8 F (38.2 C)  Recent Labs  Lab 09/06/19 0922 09/06/19 0934 09/06/19 1040 09/06/19 1112  WBC  --   --  7.6  --   CREATININE 1.68*  --   --   --   LATICACIDVEN  --  2.0*  --  1.8    Estimated Creatinine Clearance: 36.9 mL/min (A) (by C-G formula based on SCr of 1.68 mg/dL (H)).    Allergies  Allergen Reactions  . Aspirin     Antimicrobials this admission: Remdesivir 11/23 >>  Rocephin/Zosyn/Vancomycin 11/23 x 1   Microbiology results: 11/23 BCx: pending 11/23 UCx: pending  11/23 COVID: POSITIVE   Thank you for allowing pharmacy to be a part of this patient's care.  Pearla Dubonnet 09/06/2019 7:34 PM

## 2019-09-07 ENCOUNTER — Inpatient Hospital Stay (HOSPITAL_COMMUNITY)
Admission: AD | Admit: 2019-09-07 | Discharge: 2019-09-14 | DRG: 177 | Disposition: E | Payer: Medicare HMO | Source: Other Acute Inpatient Hospital | Attending: Internal Medicine | Admitting: Internal Medicine

## 2019-09-07 DIAGNOSIS — E669 Obesity, unspecified: Secondary | ICD-10-CM | POA: Diagnosis present

## 2019-09-07 DIAGNOSIS — L120 Bullous pemphigoid: Secondary | ICD-10-CM | POA: Diagnosis not present

## 2019-09-07 DIAGNOSIS — J9601 Acute respiratory failure with hypoxia: Secondary | ICD-10-CM | POA: Diagnosis not present

## 2019-09-07 DIAGNOSIS — Z515 Encounter for palliative care: Secondary | ICD-10-CM

## 2019-09-07 DIAGNOSIS — J1289 Other viral pneumonia: Secondary | ICD-10-CM | POA: Diagnosis not present

## 2019-09-07 DIAGNOSIS — Z96659 Presence of unspecified artificial knee joint: Secondary | ICD-10-CM | POA: Diagnosis present

## 2019-09-07 DIAGNOSIS — N179 Acute kidney failure, unspecified: Secondary | ICD-10-CM | POA: Diagnosis present

## 2019-09-07 DIAGNOSIS — Z6836 Body mass index (BMI) 36.0-36.9, adult: Secondary | ICD-10-CM

## 2019-09-07 DIAGNOSIS — I482 Chronic atrial fibrillation, unspecified: Secondary | ICD-10-CM | POA: Diagnosis not present

## 2019-09-07 DIAGNOSIS — D6959 Other secondary thrombocytopenia: Secondary | ICD-10-CM | POA: Diagnosis present

## 2019-09-07 DIAGNOSIS — J9600 Acute respiratory failure, unspecified whether with hypoxia or hypercapnia: Secondary | ICD-10-CM | POA: Diagnosis not present

## 2019-09-07 DIAGNOSIS — I4891 Unspecified atrial fibrillation: Secondary | ICD-10-CM | POA: Diagnosis present

## 2019-09-07 DIAGNOSIS — Z79899 Other long term (current) drug therapy: Secondary | ICD-10-CM | POA: Diagnosis not present

## 2019-09-07 DIAGNOSIS — E860 Dehydration: Secondary | ICD-10-CM | POA: Diagnosis present

## 2019-09-07 DIAGNOSIS — Z791 Long term (current) use of non-steroidal anti-inflammatories (NSAID): Secondary | ICD-10-CM | POA: Diagnosis not present

## 2019-09-07 DIAGNOSIS — G9341 Metabolic encephalopathy: Secondary | ICD-10-CM | POA: Diagnosis present

## 2019-09-07 DIAGNOSIS — E872 Acidosis: Secondary | ICD-10-CM | POA: Diagnosis present

## 2019-09-07 DIAGNOSIS — Z888 Allergy status to other drugs, medicaments and biological substances status: Secondary | ICD-10-CM | POA: Diagnosis not present

## 2019-09-07 DIAGNOSIS — Z66 Do not resuscitate: Secondary | ICD-10-CM | POA: Diagnosis not present

## 2019-09-07 DIAGNOSIS — J690 Pneumonitis due to inhalation of food and vomit: Secondary | ICD-10-CM | POA: Diagnosis not present

## 2019-09-07 DIAGNOSIS — G4733 Obstructive sleep apnea (adult) (pediatric): Secondary | ICD-10-CM | POA: Diagnosis present

## 2019-09-07 DIAGNOSIS — L89301 Pressure ulcer of unspecified buttock, stage 1: Secondary | ICD-10-CM | POA: Diagnosis present

## 2019-09-07 DIAGNOSIS — Z9981 Dependence on supplemental oxygen: Secondary | ICD-10-CM | POA: Diagnosis not present

## 2019-09-07 DIAGNOSIS — U071 COVID-19: Principal | ICD-10-CM | POA: Diagnosis present

## 2019-09-07 DIAGNOSIS — J1282 Pneumonia due to coronavirus disease 2019: Secondary | ICD-10-CM | POA: Diagnosis present

## 2019-09-07 DIAGNOSIS — Z87891 Personal history of nicotine dependence: Secondary | ICD-10-CM | POA: Diagnosis not present

## 2019-09-07 LAB — C-REACTIVE PROTEIN: CRP: 21.2 mg/dL — ABNORMAL HIGH (ref <1.0)

## 2019-09-07 LAB — URINE CULTURE: Culture: NO GROWTH

## 2019-09-07 LAB — D-DIMER, QUANTITATIVE: D-Dimer, Quant: 0.8 ug/mL-FEU — ABNORMAL HIGH (ref 0.00–0.50)

## 2019-09-07 LAB — ABO/RH: ABO/RH(D): A POS

## 2019-09-07 LAB — TROPONIN I (HIGH SENSITIVITY)
Troponin I (High Sensitivity): 71 ng/L — ABNORMAL HIGH (ref <18)
Troponin I (High Sensitivity): 74 ng/L — ABNORMAL HIGH (ref <18)

## 2019-09-07 LAB — PROCALCITONIN: Procalcitonin: 1.71 ng/mL

## 2019-09-07 MED ORDER — VITAMIN C 500 MG PO TABS
500.0000 mg | ORAL_TABLET | Freq: Every day | ORAL | Status: DC
  Administered 2019-09-07 – 2019-09-08 (×2): 500 mg via ORAL
  Filled 2019-09-07 (×2): qty 1

## 2019-09-07 MED ORDER — ZINC SULFATE 220 (50 ZN) MG PO CAPS
220.0000 mg | ORAL_CAPSULE | Freq: Every day | ORAL | Status: DC
  Administered 2019-09-07 – 2019-09-08 (×2): 220 mg via ORAL
  Filled 2019-09-07 (×2): qty 1

## 2019-09-07 MED ORDER — SODIUM CHLORIDE 0.9% FLUSH: 3.0000 mL | INTRAVENOUS | Status: DC | PRN

## 2019-09-07 MED ORDER — ACETAMINOPHEN 325 MG PO TABS
650.0000 mg | ORAL_TABLET | Freq: Four times a day (QID) | ORAL | Status: DC | PRN
  Administered 2019-09-07: 650 mg via ORAL
  Filled 2019-09-07: qty 2

## 2019-09-07 MED ORDER — SODIUM CHLORIDE 0.9% IV SOLUTION: Freq: Once | INTRAVENOUS | Status: DC

## 2019-09-07 MED ORDER — HYDROCOD POLST-CPM POLST ER 10-8 MG/5ML PO SUER
5.0000 mL | Freq: Two times a day (BID) | ORAL | Status: DC | PRN
  Administered 2019-09-07: 5 mL via ORAL
  Filled 2019-09-07: qty 5

## 2019-09-07 MED ORDER — DILTIAZEM HCL-DEXTROSE 125-5 MG/125ML-% IV SOLN (PREMIX)
5.0000 mg/h | INTRAVENOUS | Status: DC
  Administered 2019-09-07: 5 mg/h via INTRAVENOUS
  Filled 2019-09-07: qty 125

## 2019-09-07 MED ORDER — SODIUM CHLORIDE 0.9% FLUSH
3.0000 mL | Freq: Two times a day (BID) | INTRAVENOUS | Status: DC
  Administered 2019-09-07 – 2019-09-09 (×7): 3 mL via INTRAVENOUS

## 2019-09-07 MED ORDER — ONDANSETRON HCL 4 MG PO TABS: 4.0000 mg | ORAL_TABLET | Freq: Four times a day (QID) | ORAL | Status: DC | PRN

## 2019-09-07 MED ORDER — SODIUM CHLORIDE 0.9 % IV SOLN: 250.0000 mL | INTRAVENOUS | Status: DC | PRN

## 2019-09-07 MED ORDER — HEPARIN (PORCINE) 25000 UT/250ML-% IV SOLN
1400.0000 [IU]/h | INTRAVENOUS | Status: DC
  Filled 2019-09-07: qty 250

## 2019-09-07 MED ORDER — GUAIFENESIN-DM 100-10 MG/5ML PO SYRP: 10.0000 mL | ORAL_SOLUTION | ORAL | Status: DC | PRN

## 2019-09-07 MED ORDER — ONDANSETRON HCL 4 MG/2ML IJ SOLN: 4.0000 mg | Freq: Four times a day (QID) | INTRAMUSCULAR | Status: DC | PRN

## 2019-09-07 MED ORDER — SODIUM CHLORIDE 0.9 % IV SOLN
100.0000 mg | INTRAVENOUS | Status: DC
  Administered 2019-09-07 – 2019-09-09 (×3): 100 mg via INTRAVENOUS
  Filled 2019-09-07 (×3): qty 100

## 2019-09-07 MED ORDER — DEXAMETHASONE SODIUM PHOSPHATE 10 MG/ML IJ SOLN
6.0000 mg | INTRAMUSCULAR | Status: DC
  Administered 2019-09-07 – 2019-09-08 (×2): 6 mg via INTRAVENOUS
  Filled 2019-09-07 (×2): qty 1

## 2019-09-07 MED ORDER — SODIUM CHLORIDE 0.9 % IV SOLN: 100.0000 mg | INTRAVENOUS | Status: DC

## 2019-09-07 MED ORDER — DILTIAZEM HCL 60 MG PO TABS
60.0000 mg | ORAL_TABLET | Freq: Four times a day (QID) | ORAL | Status: DC
  Administered 2019-09-07 – 2019-09-08 (×6): 60 mg via ORAL
  Filled 2019-09-07 (×13): qty 1

## 2019-09-07 NOTE — Progress Notes (Signed)
Pt was confused and lethargic in the morning. As the day went on his neuro status started to improve. HFNC was weaned down from 10L to 8L.

## 2019-09-07 NOTE — Progress Notes (Signed)
Day 0 progress note    Todd Wall  QMV:[id number] DOB: 1926-07-26 DOA: 08/20/2019 PCP: Jerl Mina, MD   Brief Narrative:  Todd Wall is a 85 y.o. male with medical history significant of osa presents to [facility] for several days of weakness, more confused and sob.  Denies any n/v/d.  No cp or abd pain.  Says he feels better on oxygen.  Pt found to be febrile with a fib w rvr and covid pna.  Requiring 6 liters [place].  Pt referred for admission for covid pna.   Assessment & Plan:   Principal Problem:   Pneumonia due to COVID-19 virus Active Problems:   BP (bullous pemphigoid)   Atrial fibrillation with RVR (HCC)   AKI (acute kidney injury) (HCC)   Acute respiratory failure due to COVID-19 [facility])   Acute hypoxic respiratory failure in the setting of COVID-19 pneumonia, POA  SpO2: 95 % O2 Flow Rate (L/min): 8 L/min -Continue to titrate oxygen as required, worsening respiratory status over the past 24 hours -Continue remdesivir, dexamethasone -Plasma given x1 08/25/2019 -discussed with wife at length, two physician sign off as below -Actemra held in the setting of positive procalcitonin, concern for questionable concurrent bacterial infection -no acute cause at this time, follow cultures -Chest x-ray personally reviewed - bibasilar opacity L>R Recent Labs    09/03/2019 0122  DDIMER 0.80*  CRP 21.2*    Acute metabolic encephalopathy, questionable dementia  -Per wife is alert to person general situation and place -Questionable baseline dementia although appears to have no formal diagnosis -Patient not currently on any dementia medications per home medication review  Afib RVR POA, resolved -Likely paroxysmal per wife -Typically rate controlled at home without medication per wife -RVR provoked in the setting of COVID-19 pneumonia, continue diltiazem  AKI, dehydration/pre-renHeart rate typically rate controlled, no rate control medications notedal -Unknown baseline,  creatinine 1.68 at intake -Follow with morning labs, tolerating p.o. quite well  Thrombocytopenia  -Unclear baseline, hold chemical DVT prophylaxis -Follow with repeat labs  Nongapped metabolic acidosis in the setting of lactic acidosis - Likely in the setting of dehydration -Continue p.o. intake  Questionable history of diabetes mellitus per wife -No home medications listed -Wife indicates this was caused by lifelong prednisone (bullous pemphigoid history) -Continue sliding scale insulin, ischemic protocol  Given the patient's severe, life threatening illness from COVID 19 it is reasonable to treat with convalescent plasma. This patient meets criteria for convalescent plasma administration according to FDA's National Expanded Access Treatment Protocol:  Laboratory confirmed COVID-19  Severe or immediately life-threatening COVID-19: . Severe disease is defined as one or more of the following: . shortness of breath (dyspnea), . respiratory frequency ? 30/min, . blood oxygen saturation ? 93%, . partial pressure of arterial oxygen to fraction of inspired oxygen ratio < 300, . lung infiltrates > 50% within 24 to 48 hours . Life-threatening disease is defined as one or more of the following: respiratory failure, septic shock, multiple organ dysfunction or failure  The patient's wife has been informed of the investigational nature of convalescent plasma which has not yet been shown to be a safe and effective treatment of COVID-19.  Written consent has been obtained, see chart. Second physician (Dr. Thedore Mins) confirmed with wife over the phone to confirm she understands the risks/benefits of convalescent plasma.    DVT prophylaxis: Holding 2/2 thrombocytopenia Code Status: Full Family Communication: Over the phone Disposition Plan: Inpatient, pending clinical course.   Time spent: 35  min   Little Ishikawa, DO [facility]  If 7PM-7AM, please contact  night-coverage [email or web address] Password [facility] 08/17/2019, 7:26 AM

## 2019-09-07 NOTE — H&P (Signed)
History and Physical    Todd Wall GMW:[id number] DOB: 06/28/1926 DOA: 09/04/2019  PCP: Jerl Mina, MD  Patient coming from: home  Chief Complaint: weakness, sob, confused  HPI: Todd HASEGAWA is a 85 y.o. male with medical history significant of osa presents to [facility] for several days of weakness, more confused and sob.  Denies any n/v/d.  No cp or abd pain.  Says he feels better on oxygen.  Pt found to be febrile with a fib w rvr and covid pna.  Requiring 6 liters [place].  Pt referred for admission for covid pna.  Review of Systems: As per HPI otherwise 10 point review of systems negative.   Past Medical History:  Diagnosis Date  . Allergy   . Arthritis   . Sleep apnea     Past Surgical History:  Procedure Laterality Date  . REPLACEMENT TOTAL KNEE       reports that he has quit smoking. He has never used smokeless tobacco. He reports that he does not drink alcohol. No history on file for drug.  Allergies  Allergen Reactions  . Aspirin     No family history on file. no premature CAD  Prior to Admission medications   Medication Sig Start Date End Date Taking? Authorizing Provider  diphenhydramine-acetaminophen (TYLENOL PM) 25-500 MG TABS tablet Take 2 tablets by mouth at bedtime.    [provider]  doxycycline (MONODOX) 100 MG capsule Take 100 mg by mouth 2 (two) times daily. 08/19/19   [provider]  meloxicam (MOBIC) 15 MG tablet Take 15 mg by mouth daily. 07/07/19   [provider]  mirtazapine (REMERON) 15 MG tablet Take 15 mg by mouth at bedtime. 08/14/19   [provider]  traZODone (DESYREL) 100 MG tablet Take 200 mg by mouth at bedtime. 05/12/19   [provider]  triamcinolone cream (KENALOG) 0.1 % Apply 1 application topically 2 (two) times daily. 08/01/19   [provider]    Physical Exam: Vitals:   08/23/2019 0100 08/31/2019 0101 09/05/2019 0410 08/24/2019 0519  BP:    (!) 125/49  Temp: 98.8 F (37.1 C)      TempSrc: Oral     Weight:  114.6 kg 114.6 kg       Constitutional: NAD, calm, comfortable Vitals:   08/29/2019 0100 09/12/2019 0101 09/10/2019 0410 08/22/2019 0519  BP:    (!) 125/49  Temp: 98.8 F (37.1 C)     TempSrc: Oral     Weight:  114.6 kg 114.6 kg    Eyes: PERRL, lids and conjunctivae normal ENMT: Mucous membranes are moist. Posterior pharynx clear of any exudate or lesions.Normal dentition.  Neck: normal, supple, no masses, no thyromegaly Respiratory: clear to auscultation bilaterally, no wheezing, no crackles. Normal respiratory effort. No accessory muscle use.  Cardiovascular: Regular rate and rhythm, no murmurs / rubs / gallops. No extremity edema. 2+ pedal pulses. No carotid bruits.  Abdomen: no tenderness, no masses palpated. No hepatosplenomegaly. Bowel sounds positive.  Musculoskeletal: no clubbing / cyanosis. No joint deformity upper and lower extremities. Good ROM, no contractures. Normal muscle tone.  Skin: no rashes, lesions, ulcers. No induration Neurologic: CN 2-12 grossly intact. Sensation intact, DTR normal. Strength 5/5 in all 4.  Psychiatric: Normal judgment and insight. Alert and oriented x 3. Normal mood.    Labs on Admission: I have personally reviewed following labs and imaging studies  CBC: Recent Labs  Lab 09/06/19 1040  WBC 7.6  NEUTROABS 6.1  HGB 10.5*  HCT 32.1*  MCV 104.6*  PLT 82*   Basic Metabolic Panel: Recent Labs  Lab 09/06/19 0922  NA 142  K 4.5  CL 106  CO2 24  GLUCOSE 152*  BUN 43*  CREATININE 1.68*  CALCIUM 8.8*   GFR: Estimated Creatinine Clearance: 36.3 mL/min (A) (by C-G formula based on SCr of 1.68 mg/dL (H)). Liver Function Tests: Recent Labs  Lab 09/06/19 0922  AST 25  ALT 20  ALKPHOS 42  BILITOT 0.9  PROT 6.0*  ALBUMIN 3.6   No results for input(s): LIPASE, AMYLASE in the last 168 hours. No results for input(s): AMMONIA in the last 168 hours. Coagulation Profile: Recent Labs  Lab 09/06/19 0934   INR 1.0   Cardiac Enzymes: No results for input(s): CKTOTAL, CKMB, CKMBINDEX, TROPONINI in the last 168 hours. BNP (last 3 results) No results for input(s): PROBNP in the last 8760 hours. HbA1C: No results for input(s): HGBA1C in the last 72 hours. CBG: No results for input(s): GLUCAP in the last 168 hours. Lipid Profile: No results for input(s): CHOL, HDL, LDLCALC, TRIG, CHOLHDL, LDLDIRECT in the last 72 hours. Thyroid Function Tests: No results for input(s): TSH, T4TOTAL, FREET4, T3FREE, THYROIDAB in the last 72 hours. Anemia Panel: No results for input(s): VITAMINB12, FOLATE, FERRITIN, TIBC, IRON, RETICCTPCT in the last 72 hours. Urine analysis:    Component Value Date/Time   COLORURINE YELLOW (A) 09/06/2019 0934   APPEARANCEUR HAZY (A) 09/06/2019 0934   APPEARANCEUR Clear 04/05/2013 1153   LABSPEC 1.018 09/06/2019 0934   LABSPEC 1.015 04/05/2013 1153   PHURINE 5.0 09/06/2019 0934   GLUCOSEU NEGATIVE 09/06/2019 0934   GLUCOSEU Negative 04/05/2013 1153   HGBUR SMALL (A) 09/06/2019 0934   BILIRUBINUR NEGATIVE 09/06/2019 0934   BILIRUBINUR Negative 04/05/2013 1153   KETONESUR NEGATIVE 09/06/2019 0934   PROTEINUR NEGATIVE 09/06/2019 0934   NITRITE NEGATIVE 09/06/2019 0934   LEUKOCYTESUR NEGATIVE 09/06/2019 0934   LEUKOCYTESUR Negative 04/05/2013 1153   Sepsis Labs: !!!!!!!!!!!!!!!!!!!!!!!!!!!!!!!!!!!!!!!!!!!! @LABRCNTIP (procalcitonin:4,lacticidven:4) ) Recent Results (from the past 240 hour(s))  SARS Coronavirus 2 by RT PCR (hospital order[facility]) Nasopharyngeal Nasopharyngeal Swab     Status: Abnormal   Collection Time: 09/06/19  1:40 PM   Specimen: Nasopharyngeal Swab  Result Value Ref Range Status   SARS Coronavirus 2 POSITIVE (A) NEGATIVE Final    Comment: CRITICAL VALUE NOTED.  VALUE IS CONSISTENT WITH PREVIOUSLY REPORTED AND CALLED VALUE. (NOTE) If result is NEGATIVE SARS-CoV-2 target nucleic acids are NOT DETECTED. The  SARS-CoV-2 RNA is generally detectable in upper and lower  respiratory specimens during the acute phase of infection. The lowest  concentration of SARS-CoV-2 viral copies this assay can detect is 250  copies / mL. A negative result does not preclude SARS-CoV-2 infection  and should not be used as the sole basis for treatment or other  patient management decisions.  A negative result may occur with  improper specimen collection / handling, submission of specimen other  than nasopharyngeal swab, presence of viral mutation(s) within the  areas targeted by this assay, and inadequate number of viral copies  (<250 copies / mL). A negative result must be combined with clinical  observations, patient history, and epidemiological information. If result is POSITIVE SARS-CoV-2 target nucleic acids are DETECTED. Th e SARS-CoV-2 RNA is generally detectable in upper and lower  respiratory specimens during the acute phase of infection.  Positive  results are indicative of active infection with SARS-CoV-2.  Clinical  correlation with patient history and other diagnostic information is  necessary to determine patient infection status.  Positive results do  not rule out bacterial infection or co-infection with other viruses. If result is PRESUMPTIVE POSTIVE SARS-CoV-2 nucleic acids MAY BE PRESENT.   A presumptive positive result was obtained on the submitted specimen  and confirmed on repeat testing.  While 2019 novel coronavirus  (SARS-CoV-2) nucleic acids may be present in the submitted sample  additional confirmatory testing may be necessary for epidemiological  and / or clinical management purposes  to differentiate between  SARS-CoV-2 and other Sarbecovirus currently known to infect humans.  If clinically indicated additional testing with an alternate test  methodology [phone]) is  advised. The SARS-CoV-2 RNA is generally  detectable in upper and lower respiratory specimens during the acute   phase of infection. The expected result is Negative. Fact Sheet for Patients:  StrictlyIdeas.no Fact Sheet for Healthcare Providers: BankingDealers.co.za This test is not yet approved or cleared by the [place] FDA and has been authorized for detection and/or diagnosis of SARS-CoV-2 by FDA under an Emergency Use Authorization (EUA).  This EUA will remain in effect (meaning this test can be used) for the duration of the COVID-19 declaration under Section 564(b)(1) of the Act, 21 U.S.C. section 360bbb-3(b)(1), unless the authorization is terminated or revoked sooner. Performed at [facility], [place] [address], [place], [place] [address]      Radiological Exams on Admission: Dg Chest Port 1 View  Result Date: 09/06/2019 CLINICAL DATA:  Dyspnea. EXAM: PORTABLE CHEST 1 VIEW COMPARISON:  Chest radiograph 10/14/2012 FINDINGS: Cardiomegaly. Aortic atherosclerosis. Opacity within the left mid lung suspicion for pneumonia given provided history. Left basilar opacity compatible with atelectasis and/or pneumonia with likely effusion. A more subtle opacity is present at the right lung base. No pneumothorax. No acute bony abnormality. IMPRESSION: Opacities within the left mid lung and left lung base suspicious for pneumonia given provided history. Left basilar atelectasis and effusion also likely present. More subtle opacity at the right lung base, which may reflect incomplete atelectasis or a disc site of infection. Cardiomegaly. Aortic atherosclerosis. Electronically Signed   By: Kellie Simmering DO   On: 09/06/2019 10:00    EKG: Independently reviewed. afib w rvr Old chart reviewed cxr reviewed left sided opacities multilobes, possible on right  Assessment/Plan 85 yo male with covid pna, hypoxia and afib with rvr  Principal Problem:   Pneumonia due to COVID-19 virus- place on decadron and remdesivir.  Supplemental o2 and wean as tolerates.   Vitc/zinc.  plts low so holding heparin.  Daily labs ordered and will be followed.    Active Problems:   Acute respiratory failure due to COVID-19 [facility])- as above    Atrial fibrillation with RVR (HCC)- dilt drip and orally load and wean dilt drip as HR tolerates, mild bump in trop likely due to rate, covid and aki.    AKI (acute kidney injury) ([place])- cr bump up to 1.6.  Follow daily    BP (bullous pemphigoid)- stable    DVT prophylaxis: scds Code Status: full Family Communication:  none Disposition Plan: days Consults called: none Admission status: admission   Todd Wall,RACHAL A MD [facility]  If 7PM-7AM, please contact night-coverage [email or web address] Password [email or web address]  08/23/2019, 5:22 AM

## 2019-09-08 DIAGNOSIS — J9600 Acute respiratory failure, unspecified whether with hypoxia or hypercapnia: Secondary | ICD-10-CM

## 2019-09-08 DIAGNOSIS — J1289 Other viral pneumonia: Secondary | ICD-10-CM | POA: Diagnosis not present

## 2019-09-08 DIAGNOSIS — U071 COVID-19: Secondary | ICD-10-CM | POA: Diagnosis not present

## 2019-09-08 DIAGNOSIS — N179 Acute kidney failure, unspecified: Secondary | ICD-10-CM

## 2019-09-08 DIAGNOSIS — I4891 Unspecified atrial fibrillation: Secondary | ICD-10-CM | POA: Diagnosis not present

## 2019-09-08 LAB — FERRITIN: Ferritin: 1603 ng/mL — ABNORMAL HIGH (ref 24–336)

## 2019-09-08 LAB — BRAIN NATRIURETIC PEPTIDE: B Natriuretic Peptide: 259.9 pg/mL — ABNORMAL HIGH (ref 0.0–100.0)

## 2019-09-08 LAB — CBC WITH DIFFERENTIAL/PLATELET
Abs Immature Granulocytes: 0.06 10*3/uL (ref 0.00–0.07)
Basophils Absolute: 0 10*3/uL (ref 0.0–0.1)
Basophils Relative: 0 %
Eosinophils Absolute: 0 10*3/uL (ref 0.0–0.5)
Eosinophils Relative: 0 %
HCT: 34.2 % — ABNORMAL LOW (ref 39.0–52.0)
Hemoglobin: 10.8 g/dL — ABNORMAL LOW (ref 13.0–17.0)
Immature Granulocytes: 1 %
Lymphocytes Relative: 3 %
Lymphs Abs: 0.3 10*3/uL — ABNORMAL LOW (ref 0.7–4.0)
MCH: 34.6 pg — ABNORMAL HIGH (ref 26.0–34.0)
MCHC: 31.6 g/dL (ref 30.0–36.0)
MCV: 109.6 fL — ABNORMAL HIGH (ref 80.0–100.0)
Monocytes Absolute: 0.2 10*3/uL (ref 0.1–1.0)
Monocytes Relative: 2 %
Neutro Abs: 8.2 10*3/uL — ABNORMAL HIGH (ref 1.7–7.7)
Neutrophils Relative %: 94 %
Platelets: 73 10*3/uL — ABNORMAL LOW (ref 150–400)
RBC: 3.12 MIL/uL — ABNORMAL LOW (ref 4.22–5.81)
RDW: 14.6 % (ref 11.5–15.5)
WBC: 8.8 10*3/uL (ref 4.0–10.5)
nRBC: 0 % (ref 0.0–0.2)

## 2019-09-08 LAB — COMPREHENSIVE METABOLIC PANEL
ALT: 22 U/L (ref 0–44)
AST: 25 U/L (ref 15–41)
Albumin: 3.1 g/dL — ABNORMAL LOW (ref 3.5–5.0)
Alkaline Phosphatase: 40 U/L (ref 38–126)
Anion gap: 8 (ref 5–15)
BUN: 38 mg/dL — ABNORMAL HIGH (ref 8–23)
CO2: 24 mmol/L (ref 22–32)
Calcium: 8.1 mg/dL — ABNORMAL LOW (ref 8.9–10.3)
Chloride: 109 mmol/L (ref 98–111)
Creatinine, Ser: 1.42 mg/dL — ABNORMAL HIGH (ref 0.61–1.24)
GFR calc Af Amer: 50 mL/min — ABNORMAL LOW (ref >60)
GFR calc non Af Amer: 43 mL/min — ABNORMAL LOW (ref >60)
Glucose, Bld: 219 mg/dL — ABNORMAL HIGH (ref 70–99)
Potassium: 4.9 mmol/L (ref 3.5–5.1)
Sodium: 141 mmol/L (ref 135–145)
Total Bilirubin: 0.6 mg/dL (ref 0.3–1.2)
Total Protein: 5.6 g/dL — ABNORMAL LOW (ref 6.5–8.1)

## 2019-09-08 LAB — HEMOGLOBIN A1C
Hgb A1c MFr Bld: 7.3 % — ABNORMAL HIGH (ref 4.8–5.6)
Mean Plasma Glucose: 162.81 mg/dL

## 2019-09-08 LAB — C-REACTIVE PROTEIN: CRP: 27.3 mg/dL — ABNORMAL HIGH (ref <1.0)

## 2019-09-08 LAB — GLUCOSE, CAPILLARY
Glucose-Capillary: 234 mg/dL — ABNORMAL HIGH (ref 70–99)
Glucose-Capillary: 350 mg/dL — ABNORMAL HIGH (ref 70–99)

## 2019-09-08 LAB — PROCALCITONIN: Procalcitonin: 1.92 ng/mL

## 2019-09-08 LAB — D-DIMER, QUANTITATIVE: D-Dimer, Quant: 0.61 ug/mL-FEU — ABNORMAL HIGH (ref 0.00–0.50)

## 2019-09-08 MED ORDER — MORPHINE BOLUS VIA INFUSION: 2.0000 mg | INTRAVENOUS | Status: DC | PRN

## 2019-09-08 MED ORDER — LORAZEPAM 0.5 MG PO TABS: 1.0000 mg | ORAL_TABLET | ORAL | Status: DC | PRN

## 2019-09-08 MED ORDER — FUROSEMIDE 10 MG/ML IJ SOLN
40.0000 mg | Freq: Once | INTRAMUSCULAR | Status: AC
  Administered 2019-09-08: 40 mg via INTRAVENOUS
  Filled 2019-09-08: qty 4

## 2019-09-08 MED ORDER — GLYCOPYRROLATE 1 MG PO TABS
1.0000 mg | ORAL_TABLET | ORAL | Status: DC | PRN
  Filled 2019-09-08: qty 1

## 2019-09-08 MED ORDER — MORPHINE SULFATE (PF) 2 MG/ML IV SOLN
2.0000 mg | INTRAVENOUS | Status: DC | PRN
  Administered 2019-09-08 – 2019-09-09 (×2): 2 mg via INTRAVENOUS
  Filled 2019-09-08 (×2): qty 1

## 2019-09-08 MED ORDER — SODIUM CHLORIDE 0.9 % IV SOLN
3.0000 g | Freq: Four times a day (QID) | INTRAVENOUS | Status: DC
  Administered 2019-09-08 – 2019-09-09 (×4): 3 g via INTRAVENOUS
  Filled 2019-09-08 (×4): qty 8

## 2019-09-08 MED ORDER — LORAZEPAM 2 MG/ML PO CONC: 1.0000 mg | ORAL | Status: DC | PRN

## 2019-09-08 MED ORDER — METHYLPREDNISOLONE SODIUM SUCC 125 MG IJ SOLR
60.0000 mg | Freq: Two times a day (BID) | INTRAMUSCULAR | Status: DC
  Administered 2019-09-08 – 2019-09-09 (×3): 60 mg via INTRAVENOUS
  Filled 2019-09-08 (×3): qty 2

## 2019-09-08 MED ORDER — INSULIN ASPART 100 UNIT/ML ~~LOC~~ SOLN
0.0000 [IU] | Freq: Three times a day (TID) | SUBCUTANEOUS | Status: DC
  Administered 2019-09-08: 3 [IU] via SUBCUTANEOUS
  Administered 2019-09-08: 7 [IU] via SUBCUTANEOUS
  Administered 2019-09-09: 5 [IU] via SUBCUTANEOUS

## 2019-09-08 MED ORDER — HALOPERIDOL LACTATE 5 MG/ML IJ SOLN: 0.5000 mg | INTRAMUSCULAR | Status: DC | PRN

## 2019-09-08 MED ORDER — LORAZEPAM 2 MG/ML IJ SOLN
1.0000 mg | INTRAMUSCULAR | Status: DC | PRN
  Administered 2019-09-09: 1 mg via INTRAVENOUS
  Filled 2019-09-08: qty 1

## 2019-09-08 MED ORDER — HALOPERIDOL LACTATE 2 MG/ML PO CONC: 0.5000 mg | ORAL | Status: DC | PRN

## 2019-09-08 MED ORDER — GLYCOPYRROLATE 0.2 MG/ML IJ SOLN: 0.2000 mg | INTRAMUSCULAR | Status: DC | PRN

## 2019-09-08 MED ORDER — HALOPERIDOL 0.5 MG PO TABS
0.5000 mg | ORAL_TABLET | ORAL | Status: DC | PRN
  Administered 2019-09-09: 0.5 mg via ORAL
  Filled 2019-09-08 (×2): qty 1

## 2019-09-08 NOTE — Ethics Note (Signed)
85 year old recently admitted for SARS-CoV-2 infection with pneumonia.  I was called by the patient's nurse and informed that his sats were in the 80s on 15 L HFNC.  100% nonrebreather mask was added, sats remained in the 80s.  Patient is agitated and pulling on mask, bilateral upper extremity soft wrist restraints ordered.  Patient is DNR/DNI.  Discussed with the patient's wife over the phone on, updated on his current progress.  She expressed understanding of his poor prognosis.  She clearly stated that she does not want him to suffer, she wishes for him to be made comfortable. - Will initiate comfort measures to include morphine for dyspnea, Haldol for agitation, Ativan for anxiety and Robinul for secretions.

## 2019-09-08 NOTE — Progress Notes (Signed)
Patient decreased LOC, becoming more lethargic and agitated and O2 sats in the low 80's.  Patient has bilateral lower extremity edema and some mottling. Vera MD paged and made aware. NRB applied and New orders followed. Will continue to monitor patient closely.

## 2019-09-08 NOTE — Progress Notes (Addendum)
PROGRESS NOTE                                                                                                                                                                                                             Patient Demographics:    Todd Wall, is a 85 y.o. male, DOB - [birth year], ZOX:[id number]  Outpatient Primary MD for the patient is Jerl Mina, MD   Admit date - 2019-09-24   LOS - 1  No chief complaint on file.      Brief Narrative: Patient is a 85 y.o. male with PMHx of OSA-who presented to the ED with several days history of weakness, confusion and shortness of breath-was found to have acute hypoxic respiratory failure in the setting of COVID-19 pneumonia.   Subjective:    Todd Wall today remains confused-he seems to be accumulating some secretions.  Still requiring around 8-10 L of oxygen to maintain O2 saturation.   Assessment  & Plan :   Acute Hypoxic Resp Failure due to Covid 19 Viral pneumonia and concurrent bacterial/aspiration pneumonia: Remains very tenuous-continue steroids and remdesivir-add Unasyn for presumed aspiration pneumonitis.Obtain SLP eval.   Given his overall tenuous clinical state-advanced age-I had a long conversation with the patient's spouse over the phone-recommended that we continue with current medical treatment but if he deteriorates any further-I advised that we will DO NOT INTUBATE-as a suspect he will have a poor outcome regardless.  Spouse agrees-DNR order placed.  Fever: afebrile  O2 requirements:  SpO2: 93 % O2 Flow Rate (L/min): (S) 11 L/min   COVID-19 Labs: Recent Labs    September 24, 2019 0122 09/08/19 0720  DDIMER 0.80* 0.61*  FERRITIN  --  1,603*  CRP 21.2* 27.3*    Lab Results  Component Value Date   SARSCOV2NAA POSITIVE (A) 09/06/2019     COVID-19 Medications: Steroids: 11/23>> Remdesivir: 11/23 Actemra: Not given due to elevated  procalcitonin Convalescent Plasma: 11/24  Other medications: Diuretics:Euvolemic-but will give Lasix x1 today. Antibiotics: Unasyn>> 11/25-we will plan 7-day course  Prone/Incentive Spirometry: Due to encephalopathy-he will not cooperate.  DVT Prophylaxis  : Held due to thrombocytopenia  Acute metabolic encephalopathy: Secondary to COVID-19 pneumonia with hypoxemia-remains confused-per RN-lucid at times-supportive care  A. fib with RVR: Back in sinus rhythm-continue diltiazem-suspect not a good long-term candidate for anticoagulation given advanced age and frailty.  AKI: Improved-suspect etiology to be hemodynamically mediated.  Thrombocytopenia: Likely secondary to viral illness-should improve with supportive care.  History of bullous pemphigoid: Appears to be on chronic steroids in the outpatient setting.  Obesity: Estimated body mass index is 36.24 kg/m as calculated from the following:   Height as of 09/06/19: 5\' 10"  (1.778 m).   Weight as of this encounter: 114.6 kg.   Palliative care: Very tenuous clinical state-advanced age-seems to be accumulating secretions-at risk for further worsening-suspect best served by continued medical care-but with no further escalation in care if patient requires intubation.  Spoke with spouse at length over the phone-see above-DNR order placed.  RN pressure injury documentation: Pressure Injury 09-11-2019 Buttocks Mid Stage I -  Intact skin with non-blanchable redness of a localized area usually over a bony prominence. (Active)  Sep 11, 2019 0109  Location: Buttocks  Location Orientation: Mid  Staging: Stage I -  Intact skin with non-blanchable redness of a localized area usually over a bony prominence.  Wound Description (Comments):   Present on Admission:     Consults  :  None  Procedures  :  None  ABG:    Component Value Date/Time   HCO3 28.9 (H) 09/06/2019 0922   O2SAT 29.4 09/06/2019 0922    Vent Settings: N/A  Condition  -Stable  Family Communication  :  Spouse updated over the phone  Code Status :  Full Code  Diet :  Diet Order            Diet Heart Room service appropriate? Yes; Fluid consistency: Thin  Diet effective now               Disposition Plan  :  Remain hospitalized  Barriers to discharge: Hypoxia requiring O2 supplementation/complete 5 days of IV Remdesivir  Antimicorbials  :    Anti-infectives (From admission, onward)   Start     Dose/Rate Route Frequency Ordered Stop   09/08/19 1230  Ampicillin-Sulbactam (UNASYN) 3 g in sodium chloride 0.9 % 100 mL IVPB     3 g 200 mL/hr over 30 Minutes Intravenous Every 6 hours 09/08/19 1115     09/08/19 1000  remdesivir 100 mg in sodium chloride 0.9 % 250 mL IVPB  Status:  Discontinued     100 mg 500 mL/hr over 30 Minutes Intravenous Every 24 hours 09-11-19 0136 2019-09-11 0205   September 11, 2019 1000  remdesivir 100 mg in sodium chloride 0.9 % 250 mL IVPB     100 mg 500 mL/hr over 30 Minutes Intravenous Every 24 hours 09/11/19 0205 09/11/19 0959      Inpatient Medications  Scheduled Meds: . sodium chloride   Intravenous Once  . diltiazem  60 mg Oral Q6H  . insulin aspart  0-9 Units Subcutaneous TID WC  . methylPREDNISolone (SOLU-MEDROL) injection  60 mg Intravenous Q12H  . sodium chloride flush  3 mL Intravenous Q12H  . vitamin C  500 mg Oral Daily  . zinc sulfate  220 mg Oral Daily   Continuous Infusions: . sodium chloride    . ampicillin-sulbactam (UNASYN) IV 3 g (09/08/19 1213)  . remdesivir 100 mg in NS 250 mL 100 mg (09/08/19 1027)   PRN Meds:.sodium chloride, acetaminophen, chlorpheniramine-HYDROcodone, guaiFENesin-dextromethorphan, ondansetron **OR** ondansetron (ZOFRAN) IV, sodium chloride flush   Time Spent in minutes  35   See all Orders from today for further details  09/10/19 M.D on 09/08/2019 at 2:04 PM  To page go to [email or web address] - use universal password  [facility] -  Office  [phone]     Objective:   Vitals:   December 13, 2018 2052 09/08/19 0413 09/08/19 0425 09/08/19 0800  BP: 127/71 (!) 142/62 139/69 138/67  Pulse: 88 82 83 84  Resp: (!) 27 (!) 26 (!) 24 15  Temp: 97.8 F (36.6 C) 97.7 F (36.5 C) 97.7 F (36.5 C) (!) 97.5 F (36.4 C)  TempSrc: Oral Oral Oral Oral  SpO2: 94%  95% 93%  Weight:        Wt Readings from Last 3 Encounters:  December 13, 2018 114.6 kg  09/06/19 118 kg  10/30/17 114.7 kg     Intake/Output Summary (Last 24 hours) at 09/08/2019 1404 Last data filed at 09/08/2019 1243 Gross per 24 hour  Intake 776 ml  Output 1226 ml  Net -450 ml     Physical Exam Gen Exam: Confused-mildly tachypneic-"gurgling" at times HEENT:atraumatic, normocephalic Chest: Significant transmitted upper airway sounds all over-rhonchi bilaterally also CVS:S1S2 regular Abdomen:soft non tender, non distended Extremities:no edema Neurology: Nonfocal-seems to be moving all 4 extremities    Data Review:    CBC Recent Labs  Lab 09/06/19 1040 09/08/19 0720  WBC 7.6 8.8  HGB 10.5* 10.8*  HCT 32.1* 34.2*  PLT 82* 73*  MCV 104.6* 109.6*  MCH 34.2* 34.6*  MCHC 32.7 31.6  RDW 14.6 14.6  LYMPHSABS 0.9 0.3*  MONOABS 0.5 0.2  EOSABS 0.0 0.0  BASOSABS 0.0 0.0    Chemistries  Recent Labs  Lab 09/06/19 0922 09/08/19 0720  NA 142 141  K 4.5 4.9  CL 106 109  CO2 24 24  GLUCOSE 152* 219*  BUN 43* 38*  CREATININE 1.68* 1.42*  CALCIUM 8.8* 8.1*  AST 25 25  ALT 20 22  ALKPHOS 42 40  BILITOT 0.9 0.6   ------------------------------------------------------------------------------------------------------------------ No results for input(s): CHOL, HDL, LDLCALC, TRIG, CHOLHDL, LDLDIRECT in the last 72 hours.  Lab Results  Component Value Date   HGBA1C 7.3 (H) March 01, 202020   ------------------------------------------------------------------------------------------------------------------ No results for input(s): TSH, T4TOTAL, T3FREE, THYROIDAB in the last 72  hours.  Invalid input(s): FREET3 ------------------------------------------------------------------------------------------------------------------ Recent Labs    09/08/19 0720  FERRITIN 1,603*    Coagulation profile Recent Labs  Lab 09/06/19 0934  INR 1.0    Recent Labs    December 13, 2018 0122 09/08/19 0720  DDIMER 0.80* 0.61*    Cardiac Enzymes No results for input(s): CKMB, TROPONINI, MYOGLOBIN in the last 168 hours.  Invalid input(s): CK ------------------------------------------------------------------------------------------------------------------    Component Value Date/Time   BNP 259.9 (H) 09/08/2019 0720    Micro Results Recent Results (from the past 240 hour(s))  Blood Culture (routine x 2)     Status: None (Preliminary result)   Collection Time: 09/06/19  9:34 AM   Specimen: BLOOD  Result Value Ref Range Status   Specimen Description BLOOD LEFT AC  Final   Special Requests   Final    BOTTLES DRAWN AEROBIC AND ANAEROBIC Blood Culture results may not be optimal due to an excessive volume of blood received in culture bottles   Culture   Final    NO GROWTH 2 DAYS Performed at [facility], [address] [place], [place] [address]    Report Status PENDING  Incomplete  Blood Culture (routine x 2)     Status: None (Preliminary result)   Collection Time: 09/06/19  9:34 AM   Specimen: BLOOD  Result Value Ref Range Status   Specimen Description BLOOD RIGHT ANTECUBITAL  Final   Special Requests   Final  BOTTLES DRAWN AEROBIC AND ANAEROBIC Blood Culture adequate volume   Culture   Final    NO GROWTH 2 DAYS Performed at [facility], [address] [place], [place] [address]    Report Status PENDING  Incomplete  Urine culture     Status: None   Collection Time: 09/06/19  9:34 AM   Specimen: In/Out Cath Urine  Result Value Ref Range Status   Specimen Description   Final    IN/OUT CATH URINE Performed at [facility], [address], [place], [place] [address]    Special Requests   Final    NONE Performed at [facility], [address], [place], [place] [address]    Culture   Final    NO GROWTH Performed at [facility], [address], [place], [place] [address]    Report Status 09/29/19 FINAL  Final  SARS Coronavirus 2 by RT PCR (hospital order, performed in [facility] hospital lab) Nasopharyngeal Nasopharyngeal Swab     Status: Abnormal   Collection Time: 09/06/19  1:40 PM   Specimen: Nasopharyngeal Swab  Result Value Ref Range Status   SARS Coronavirus 2 POSITIVE (A) NEGATIVE Final    Comment: CRITICAL VALUE NOTED.  VALUE IS CONSISTENT WITH PREVIOUSLY REPORTED AND CALLED VALUE. (NOTE) If result is NEGATIVE SARS-CoV-2 target nucleic acids are NOT DETECTED. The SARS-CoV-2 RNA is generally detectable in upper and lower  respiratory specimens during the acute phase of infection. The lowest  concentration of SARS-CoV-2 viral copies this assay can detect is 250  copies / mL. A negative result does not preclude SARS-CoV-2 infection  and should not be used as the sole basis for treatment or other  patient management decisions.  A negative result may occur with  improper specimen collection / handling, submission of specimen other  than nasopharyngeal swab, presence of viral mutation(s) within the  areas targeted by this assay, and inadequate number of viral copies  (<250 copies / mL). A negative result must be combined with clinical  observations, patient history, and epidemiological information. If result is POSITIVE SARS-CoV-2 target nucleic acids are DETECTED. Th e SARS-CoV-2 RNA is generally detectable in upper and lower  respiratory specimens during the acute phase of infection.  Positive  results are indicative of active infection with SARS-CoV-2.  Clinical  correlation with patient history and other diagnostic information is  necessary to determine patient infection status.   Positive results do  not rule out bacterial infection or co-infection with other viruses. If result is PRESUMPTIVE POSTIVE SARS-CoV-2 nucleic acids MAY BE PRESENT.   A presumptive positive result was obtained on the submitted specimen  and confirmed on repeat testing.  While 2019 novel coronavirus  (SARS-CoV-2) nucleic acids may be present in the submitted sample  additional confirmatory testing may be necessary for epidemiological  and / or clinical management purposes  to differentiate between  SARS-CoV-2 and other Sarbecovirus currently known to infect humans.  If clinically indicated additional testing with an alternate test  methodology [phone]) is  advised. The SARS-CoV-2 RNA is generally  detectable in upper and lower respiratory specimens during the acute  phase of infection. The expected result is Negative. Fact Sheet for Patients:  BoilerBrush.com.cy Fact Sheet for Healthcare Providers: https://pope.com/ This test is not yet approved or cleared by the [place] FDA and has been authorized for detection and/or diagnosis of SARS-CoV-2 by FDA under an Emergency Use Authorization (EUA).  This EUA will remain in effect (  meaning this test can be used) for the duration of the COVID-19 declaration under Section 564(b)(1) of the Act, 21 U.S.C. section 360bbb-3(b)(1), unless the authorization is terminated or revoked sooner. Performed at [facility], [place] [address] [place], [place] [address]     Radiology Reports Dg Chest Port 1 View  Result Date: 09/06/2019 CLINICAL DATA:  Dyspnea. EXAM: PORTABLE CHEST 1 VIEW COMPARISON:  Chest radiograph 10/14/2012 FINDINGS: Cardiomegaly. Aortic atherosclerosis. Opacity within the left mid lung suspicion for pneumonia given provided history. Left basilar opacity compatible with atelectasis and/or pneumonia with likely effusion. A more subtle opacity is present at the right lung base. No  pneumothorax. No acute bony abnormality. IMPRESSION: Opacities within the left mid lung and left lung base suspicious for pneumonia given provided history. Left basilar atelectasis and effusion also likely present. More subtle opacity at the right lung base, which may reflect incomplete atelectasis or a disc site of infection. Cardiomegaly. Aortic atherosclerosis. Electronically Signed   By: Kellie Simmering DO   On: 09/06/2019 10:00

## 2019-09-08 NOTE — Progress Notes (Signed)
At change of shift pt was confused and had mitts on due to pulling at IV and condom cath. HFNC was adjusted to 11L due to stating in the high 80s. Pt is not tolerating 11L sating 91%. MD ordered solumedrol and pt received a dose of remdesivir.

## 2019-09-08 NOTE — Progress Notes (Signed)
Pharmacy Antibiotic Note  Todd Wall is a 85 y.o. male admitted on 09/13/2019 with COVID-19 PNA and concurrent aspiration PNA.  Pharmacy has been consulted for Unasyn dosing.  Plan: Unasyn 3 g iv q 6 h planned duration 7d -F/U renal function, cultures, plans for abx  Weight: 252 lb 9.6 oz (114.6 kg)  Temp (24hrs), Avg:97.7 F (36.5 C), Min:97.5 F (36.4 C), Max:97.9 F (36.6 C)  Recent Labs  Lab 09/06/19 0922 09/06/19 0934 09/06/19 1040 09/06/19 1112 09/08/19 0720  WBC  --   --  7.6  --  8.8  CREATININE 1.68*  --   --   --  1.42*  LATICACIDVEN  --  2.0*  --  1.8  --     Estimated Creatinine Clearance: 42.9 mL/min (A) (by C-G formula based on SCr of 1.42 mg/dL (H)).    Allergies  Allergen Reactions  . Aspirin Swelling    remdesivir 11/23>>11/27 azithromycin 11/23>>11/24 Ceftriaxone 11/23>.11/24 Vancomycin 11/23>>11/24 Zosyn 11/23>>11/24 Unasyn 11/25>>   11/23 BCx2>> ngtd  11/23 UCx >> negF   Thank you for allowing pharmacy to be a part of this patient's care.  [name] 09/08/2019 2:32 PM

## 2019-09-08 NOTE — Evaluation (Signed)
Clinical/Bedside Swallow Evaluation Patient Details  Name: Todd Wall MRN: [id number] Date of Birth: Sep 29, 1926  Today's Date: 09/08/2019 Time: SLP Start Time (ACUTE ONLY): 33 SLP Stop Time (ACUTE ONLY): 1528 SLP Time Calculation (min) (ACUTE ONLY): 15 min  Past Medical History:  Past Medical History:  Diagnosis Date  . Allergy   . Arthritis   . Sleep apnea    Past Surgical History:  Past Surgical History:  Procedure Laterality Date  . REPLACEMENT TOTAL KNEE     HPI:  Pt is a 85 y.o. male admitted with acute hypoxic respiratory failure int he setting of COVID-19 PNA. On 11/25 pt had a decline in mental status and there was concern for aspiration. PMH includes: OSA, question of baseline dementia per MD note although not diagnosed   Assessment / Plan / Recommendation Clinical Impression  Pt has a functional appearing oropharyngeal swallow without overt signs of aspiration; however, I think risk for aspiration is present given his fluctuating mentation and decreased respiratory status. Per RN and MD, he was more altered this morning and appeared to have difficulty managing his secretions and taking meds in puree. He has audible congestion this afternoon, although his voice does not sound wet. I think that he is capable of swallowing, but my concern would be that if he were to intermittently aspirate as his mentation fluctuates, he would not tolerate it well from a respiratory standpoint. He is somewhat tenuous on 15L [place] (SpO2 hovering in the mid-80s throughout eval).   After discussing this with MD, will plan to make pt NPO for now except for small offerings of purees from the floor stock or small sips of water after oral care. This should be done only when pt is fully upright and alert, and when full supervision can be provided by staff. Will f/u over the next 48 hours or so to see how he tolerates this and how he may progress from a cognitive and respiratory standpoint before  initiating a diet of these consistencies.   SLP Visit Diagnosis: Dysphagia, unspecified (R13.10)    Aspiration Risk  Moderate aspiration risk    Diet Recommendation NPO except meds;Ice chips PRN after oral care;Free water protocol after oral care(bites of purees from floor stock if fully alert)   Medication Administration: Crushed with puree    Other  Recommendations Oral Care Recommendations: Oral care QID;Oral care prior to ice chip/H20 Other Recommendations: Have oral suction available   Follow up Recommendations (tba)      Frequency and Duration min 2x/week  2 weeks       Prognosis Prognosis for Safe Diet Advancement: Fair Barriers to Reach Goals: Cognitive deficits      Swallow Study   General HPI: Pt is a 85 y.o. male admitted with acute hypoxic respiratory failure int he setting of COVID-19 PNA. On 11/25 pt had a decline in mental status and there was concern for aspiration. PMH includes: OSA, question of baseline dementia per MD note although not diagnosed Type of Study: Bedside Swallow Evaluation Previous Swallow Assessment: none in chart Diet Prior to this Study: Regular;Thin liquids Temperature Spikes Noted: No Respiratory Status: Nasal cannula(15L) History of Recent Intubation: No Behavior/Cognition: Alert;Cooperative;Requires cueing Oral Cavity Assessment: Within Functional Limits Oral Care Completed by SLP: No Oral Cavity - Dentition: Adequate natural dentition Vision: Functional for self-feeding Self-Feeding Abilities: Needs assist Patient Positioning: Upright in bed Baseline Vocal Quality: Normal Volitional Swallow: Unable to elicit    Oral/Motor/Sensory Function Overall Oral Motor/Sensory Function: Within functional  limits(difficulty following commands but looks symmetricakl)   Ice Chips Ice chips: Within functional limits Presentation: Spoon   Thin Liquid Thin Liquid: Within functional limits Presentation: Self Fed;Cup;Spoon;Straw    Nectar Thick  Nectar Thick Liquid: Not tested   Honey Thick Honey Thick Liquid: Not tested   Puree Puree: Within functional limits Presentation: Spoon   Solid     Solid: Not tested      Virl Axe Amando Ishikawa 09/08/2019,3:42 PM   Ivar Drape, M.A. CCC-SLP Acute Herbalist [phone] Office [phone]

## 2019-09-09 DIAGNOSIS — Z515 Encounter for palliative care: Secondary | ICD-10-CM

## 2019-09-09 DIAGNOSIS — I4891 Unspecified atrial fibrillation: Secondary | ICD-10-CM | POA: Diagnosis not present

## 2019-09-09 DIAGNOSIS — N179 Acute kidney failure, unspecified: Secondary | ICD-10-CM | POA: Diagnosis not present

## 2019-09-09 DIAGNOSIS — U071 COVID-19: Secondary | ICD-10-CM | POA: Diagnosis not present

## 2019-09-09 DIAGNOSIS — J1289 Other viral pneumonia: Secondary | ICD-10-CM | POA: Diagnosis not present

## 2019-09-09 LAB — COMPREHENSIVE METABOLIC PANEL
ALT: 20 U/L (ref 0–44)
AST: 22 U/L (ref 15–41)
Albumin: 2.7 g/dL — ABNORMAL LOW (ref 3.5–5.0)
Alkaline Phosphatase: 37 U/L — ABNORMAL LOW (ref 38–126)
Anion gap: 11 (ref 5–15)
BUN: 54 mg/dL — ABNORMAL HIGH (ref 8–23)
CO2: 23 mmol/L (ref 22–32)
Calcium: 8 mg/dL — ABNORMAL LOW (ref 8.9–10.3)
Chloride: 111 mmol/L (ref 98–111)
Creatinine, Ser: 1.91 mg/dL — ABNORMAL HIGH (ref 0.61–1.24)
GFR calc Af Amer: 35 mL/min — ABNORMAL LOW (ref >60)
GFR calc non Af Amer: 30 mL/min — ABNORMAL LOW (ref >60)
Glucose, Bld: 324 mg/dL — ABNORMAL HIGH (ref 70–99)
Potassium: 5.2 mmol/L — ABNORMAL HIGH (ref 3.5–5.1)
Sodium: 145 mmol/L (ref 135–145)
Total Bilirubin: 0.5 mg/dL (ref 0.3–1.2)
Total Protein: 5.4 g/dL — ABNORMAL LOW (ref 6.5–8.1)

## 2019-09-09 LAB — CBC WITH DIFFERENTIAL/PLATELET
Abs Immature Granulocytes: 0.01 10*3/uL (ref 0.00–0.07)
Basophils Absolute: 0 10*3/uL (ref 0.0–0.1)
Basophils Relative: 0 %
Eosinophils Absolute: 0 10*3/uL (ref 0.0–0.5)
Eosinophils Relative: 0 %
HCT: 38.5 % — ABNORMAL LOW (ref 39.0–52.0)
Hemoglobin: 11.9 g/dL — ABNORMAL LOW (ref 13.0–17.0)
Immature Granulocytes: 0 %
Lymphocytes Relative: 7 %
Lymphs Abs: 0.5 10*3/uL — ABNORMAL LOW (ref 0.7–4.0)
MCH: 33.7 pg (ref 26.0–34.0)
MCHC: 30.9 g/dL (ref 30.0–36.0)
MCV: 109.1 fL — ABNORMAL HIGH (ref 80.0–100.0)
Monocytes Absolute: 0.1 10*3/uL (ref 0.1–1.0)
Monocytes Relative: 2 %
Neutro Abs: 5.7 10*3/uL (ref 1.7–7.7)
Neutrophils Relative %: 91 %
Platelets: 91 10*3/uL — ABNORMAL LOW (ref 150–400)
RBC: 3.53 MIL/uL — ABNORMAL LOW (ref 4.22–5.81)
RDW: 14.5 % (ref 11.5–15.5)
WBC: 6.3 10*3/uL (ref 4.0–10.5)
nRBC: 0 % (ref 0.0–0.2)

## 2019-09-09 LAB — FERRITIN: Ferritin: 1386 ng/mL — ABNORMAL HIGH (ref 24–336)

## 2019-09-09 LAB — BPAM FFP
Blood Product Expiration Date: 202011260318
ISSUE DATE / TIME: 202011250324
Unit Type and Rh: 6200

## 2019-09-09 LAB — PREPARE FRESH FROZEN PLASMA: Unit division: 0

## 2019-09-09 LAB — D-DIMER, QUANTITATIVE: D-Dimer, Quant: 0.62 ug/mL-FEU — ABNORMAL HIGH (ref 0.00–0.50)

## 2019-09-09 LAB — C-REACTIVE PROTEIN: CRP: 29.4 mg/dL — ABNORMAL HIGH (ref <1.0)

## 2019-09-09 LAB — GLUCOSE, CAPILLARY: Glucose-Capillary: 269 mg/dL — ABNORMAL HIGH (ref 70–99)

## 2019-09-09 MED ORDER — ONDANSETRON HCL 4 MG/2ML IJ SOLN: 4.0000 mg | Freq: Four times a day (QID) | INTRAMUSCULAR | Status: DC | PRN

## 2019-09-09 MED ORDER — HALOPERIDOL LACTATE 2 MG/ML PO CONC
0.5000 mg | ORAL | Status: DC | PRN
  Filled 2019-09-09: qty 0.3

## 2019-09-09 MED ORDER — DIPHENHYDRAMINE HCL 50 MG/ML IJ SOLN: 12.5000 mg | INTRAMUSCULAR | Status: DC | PRN

## 2019-09-09 MED ORDER — LORAZEPAM 2 MG/ML IJ SOLN: 1.0000 mg | INTRAMUSCULAR | Status: DC | PRN

## 2019-09-09 MED ORDER — ONDANSETRON 4 MG PO TBDP: 4.0000 mg | ORAL_TABLET | Freq: Four times a day (QID) | ORAL | Status: DC | PRN

## 2019-09-09 MED ORDER — LORAZEPAM 2 MG/ML PO CONC: 1.0000 mg | ORAL | Status: DC | PRN

## 2019-09-09 MED ORDER — HALOPERIDOL 0.5 MG PO TABS
0.5000 mg | ORAL_TABLET | ORAL | Status: DC | PRN
  Filled 2019-09-09: qty 1

## 2019-09-09 MED ORDER — HYDROMORPHONE HCL 1 MG/ML PO LIQD: 1.0000 mg | ORAL | Status: DC | PRN

## 2019-09-09 MED ORDER — LORAZEPAM 0.5 MG PO TABS: 1.0000 mg | ORAL_TABLET | ORAL | Status: DC | PRN

## 2019-09-09 MED ORDER — SODIUM CHLORIDE 0.9% FLUSH: 3.0000 mL | Freq: Two times a day (BID) | INTRAVENOUS | Status: DC

## 2019-09-09 MED ORDER — HYDROMORPHONE HCL 1 MG/ML IJ SOLN: 1.0000 mg | INTRAMUSCULAR | Status: DC | PRN

## 2019-09-09 MED ORDER — SODIUM CHLORIDE 0.9% FLUSH: 3.0000 mL | INTRAVENOUS | Status: DC | PRN

## 2019-09-09 MED ORDER — HYDROMORPHONE HCL 1 MG/ML IJ SOLN
1.0000 mg | INTRAMUSCULAR | Status: DC | PRN
  Administered 2019-09-09: 1 mg via INTRAVENOUS
  Filled 2019-09-09: qty 1

## 2019-09-09 MED ORDER — HALOPERIDOL LACTATE 5 MG/ML IJ SOLN: 0.5000 mg | INTRAMUSCULAR | Status: DC | PRN

## 2019-09-09 MED ORDER — SODIUM CHLORIDE 0.9 % IV SOLN: 250.0000 mL | INTRAVENOUS | Status: DC | PRN

## 2019-09-11 LAB — CULTURE, BLOOD (ROUTINE X 2)
Culture: NO GROWTH
Culture: NO GROWTH
Special Requests: ADEQUATE

## 2019-09-14 NOTE — Death Summary Note (Signed)
DEATH SUMMARY   Patient Details  Name: Todd Wall MRN: [id number] DOB: 06-12-1926  Admission/Discharge Information   Admit Date:  09/30/2019  Date of Death: Date of Death: October 02, 2019  Time of Death: Time of Death: 02/17/26  Length of Stay: 2  Referring Physician: Maryland Pink, MD   Reason(s) for Hospitalization   Acute hypoxic respiratory failure secondary to COVID-19 and pneumonia  Diagnoses  Preliminary cause of death:   Acute hypoxic respiratory failure secondary to COVID-19 pneumonia and aspiration pneumonia  Secondary Diagnoses (including complications and co-morbidities):  Acute metabolic encephalopathy BP (bullous pemphigoid) Atrial fibrillation with RVR ([place]) AKI (acute kidney injury) [facility]) Palliative care encounter   Brief Hospital Course (including significant findings, care, treatment, and services provided and events leading to death)  Brief summary: Patient is a 85 y.o. male with PMHx of OSA-who presented to the ED with several days history of weakness, confusion and shortness of breath-was found to have acute hypoxic respiratory failure in the setting of COVID-19 pneumonia.  Hospital course complicated by worsening hypoxemia and encephalopathy.  After extensive discussion with family-made DNR-and subsequently transitiones to comfort measures.  Patient subsequently expired on Oct 02, 2019.  Hospital course by problem list:  Acute Hypoxic Resp Failure due to Covid 19 Viral pneumonia and concurrent bacterial/aspiration pneumonia:  Even though after being started on remdesivir/steroids and empiric antimicrobial therapy-patient hypoxemic continue to worsen.  It was felt that patient had a significant component of aspiration pneumonia.  After extensive discussion with family-patient was made a DNR-and due to continued deterioration-was subsequently transitioned to full comfort measures on 02-Oct-2023.  Acute metabolic encephalopathy: Secondary to COVID-19 pneumonia with  hypoxemia-continue to remain confused throughout this hospital stay.  A. fib with RVR: Reverted back to sinus rhythm spontaneously-was maintained on diltiazem-not felt to be a long-term anticoagulation candidate due to advanced age and frailty.   AKI: Initially had improved but creatinine then slowly started trending up-etiology suspected to be hemodynamically mediated.  Now creatinine is trending up.    Thrombocytopenia: Likely secondary to viral illness-managed with supportive care  History of bullous pemphigoid: Appears to be on chronic steroids in the outpatient setting.  Steroids discontinued as patient transition to full comfort measures.  Obesity: Estimated body mass index is 36.24 kg/m as calculated from the following:   Height as of 09/06/19: 5\' 10"  (1.778 m).   Weight as of this encounter: 114.6 kg.   Palliative care:  Patient continued to deteriorate during this hospital stay-this MD had extensive discussion with patient's spouse-and patient was made a DNR on 11/25.  Along with COVID-19-patient had significant amount of aspiration pneumonitis.  Unfortunately even with empiric antibiotics for aspiration pneumonia-keeping patient n.p.o.-he continued to deteriorate.  Case was again discussed with patient's spouse on 10/02/23 multiple times-after which patient was then transition to comfort measures.  Pertinent Labs and Studies  Significant Diagnostic Studies Dg Chest Port 1 View  Result Date: 09/06/2019 CLINICAL DATA:  Dyspnea. EXAM: PORTABLE CHEST 1 VIEW COMPARISON:  Chest radiograph 10/14/2012 FINDINGS: Cardiomegaly. Aortic atherosclerosis. Opacity within the left mid lung suspicion for pneumonia given provided history. Left basilar opacity compatible with atelectasis and/or pneumonia with likely effusion. A more subtle opacity is present at the right lung base. No pneumothorax. No acute bony abnormality. IMPRESSION: Opacities within the left mid lung and left lung base suspicious  for pneumonia given provided history. Left basilar atelectasis and effusion also likely present. More subtle opacity at the right lung base, which may reflect incomplete atelectasis or a disc  site of infection. Cardiomegaly. Aortic atherosclerosis. Electronically Signed   By: Jackey Loge DO   On: 09/06/2019 10:00    Microbiology Recent Results (from the past 240 hour(s))  Blood Culture (routine x 2)     Status: None (Preliminary result)   Collection Time: 09/06/19  9:34 AM   Specimen: BLOOD  Result Value Ref Range Status   Specimen Description BLOOD LEFT AC  Final   Special Requests   Final    BOTTLES DRAWN AEROBIC AND ANAEROBIC Blood Culture results may not be optimal due to an excessive volume of blood received in culture bottles   Culture   Final    NO GROWTH 3 DAYS Performed at [facility], [address] [place], [place] [address]    Report Status PENDING  Incomplete  Blood Culture (routine x 2)     Status: None (Preliminary result)   Collection Time: 09/06/19  9:34 AM   Specimen: BLOOD  Result Value Ref Range Status   Specimen Description BLOOD RIGHT ANTECUBITAL  Final   Special Requests   Final    BOTTLES DRAWN AEROBIC AND ANAEROBIC Blood Culture adequate volume   Culture   Final    NO GROWTH 3 DAYS Performed at [facility], [address] [place], [place] [address]    Report Status PENDING  Incomplete  Urine culture     Status: None   Collection Time: 09/06/19  9:34 AM   Specimen: In/Out Cath Urine  Result Value Ref Range Status   Specimen Description   Final    IN/OUT CATH URINE Performed at [facility], [address], [place], [place] [address]    Special Requests   Final    NONE Performed at [facility], [address], [place], [place] [address]    Culture   Final    NO GROWTH Performed at [facility], [address], [place], [place] [address]    Report Status 09/08/2019 FINAL  Final  SARS Coronavirus 2  by RT PCR (hospital order, performed in [facility] hospital lab) Nasopharyngeal Nasopharyngeal Swab     Status: Abnormal   Collection Time: 09/06/19  1:40 PM   Specimen: Nasopharyngeal Swab  Result Value Ref Range Status   SARS Coronavirus 2 POSITIVE (A) NEGATIVE Final    Comment: CRITICAL VALUE NOTED.  VALUE IS CONSISTENT WITH PREVIOUSLY REPORTED AND CALLED VALUE. (NOTE) If result is NEGATIVE SARS-CoV-2 target nucleic acids are NOT DETECTED. The SARS-CoV-2 RNA is generally detectable in upper and lower  respiratory specimens during the acute phase of infection. The lowest  concentration of SARS-CoV-2 viral copies this assay can detect is 250  copies / mL. A negative result does not preclude SARS-CoV-2 infection  and should not be used as the sole basis for treatment or other  patient management decisions.  A negative result may occur with  improper specimen collection / handling, submission of specimen other  than nasopharyngeal swab, presence of viral mutation(s) within the  areas targeted by this assay, and inadequate number of viral copies  (<250 copies / mL). A negative result must be combined with clinical  observations, patient history, and epidemiological information. If result is POSITIVE SARS-CoV-2 target nucleic acids are DETECTED. Th e SARS-CoV-2 RNA is generally detectable in upper and lower  respiratory specimens during the acute phase of infection.  Positive  results are indicative of active infection with SARS-CoV-2.  Clinical  correlation with patient history and other diagnostic information is  necessary to determine patient infection status.  Positive results do  not rule out bacterial infection or co-infection with other viruses. If result is PRESUMPTIVE POSTIVE SARS-CoV-2 nucleic acids MAY BE PRESENT.   A presumptive positive result was obtained on the submitted specimen  and confirmed on repeat testing.  While 2019 novel coronavirus  (SARS-CoV-2) nucleic acids  may be present in the submitted sample  additional confirmatory testing may be necessary for epidemiological  and / or clinical management purposes  to differentiate between  SARS-CoV-2 and other Sarbecovirus currently known to infect humans.  If clinically indicated additional testing with an alternate test  methodology [phone]([id number]) is  advised. The SARS-CoV-2 RNA is generally  detectable in upper and lower respiratory specimens during the acute  phase of infection. The expected result is Negative. Fact Sheet for Patients:  BoilerBrush.com.cyhttps://www.fda.gov/media/136312/download Fact Sheet for Healthcare Providers: https://pope.com/https://www.fda.gov/media/136313/download This test is not yet approved or cleared by the [place] FDA and has been authorized for detection and/or diagnosis of SARS-CoV-2 by FDA under an Emergency Use Authorization (EUA).  This EUA will remain in effect (meaning this test can be used) for the duration of the COVID-19 declaration under Section 564(b)(1) of the Act, 21 U.S.C. section 360bbb-3(b)(1), unless the authorization is terminated or revoked sooner. Performed at [facility], [address] [place], [place] [address]     Lab Basic Metabolic Panel: Recent Labs  Lab 09/06/19 [id number] 09/08/19 0720 09/12/2019 0814  NA 142 141 145  K 4.5 4.9 5.2*  CL 106 109 111  CO2 24 24 23   GLUCOSE 152* 219* 324*  BUN 43* 38* 54*  CREATININE 1.68* 1.42* 1.91*  CALCIUM 8.8* 8.1* 8.0*   Liver Function Tests: Recent Labs  Lab 09/06/19 0922 09/08/19 0720 08/19/2019 0814  AST 25 25 22   ALT 20 22 20   ALKPHOS 42 40 37*  BILITOT 0.9 0.6 0.5  PROT 6.0* 5.6* 5.4*  ALBUMIN 3.6 3.1* 2.7*   No results for input(s): LIPASE, AMYLASE in the last 168 hours. No results for input(s): AMMONIA in the last 168 hours. CBC: Recent Labs  Lab 09/06/19 1040 09/08/19 0720 09/06/2019 0814  WBC 7.6 8.8 6.3  NEUTROABS 6.1 8.2* 5.7  HGB 10.5* 10.8* 11.9*  HCT 32.1* 34.2* 38.5*  MCV 104.6* 109.6*  109.1*  PLT 82* 73* 91*   Cardiac Enzymes: No results for input(s): CKTOTAL, CKMB, CKMBINDEX, TROPONINI in the last 168 hours. Sepsis Labs: Recent Labs  Lab 09/06/19 0934 09/06/19 1040 09/06/19 1112 11-24-2018 0122 09/08/19 0720 09/12/2019 0814  PROCALCITON  --   --   --  1.71 1.92  --   WBC  --  7.6  --   --  8.8 6.3  LATICACIDVEN 2.0*  --  1.8  --   --   --     Procedures/Operations    Artice Holohan 08/15/2019, 3:20 PM

## 2019-09-14 NOTE — Progress Notes (Signed)
OT Cancellation Note  Patient Details Name: BELINDA BRINGHURST MRN: [id number] DOB: 06-22-1926   Cancelled Treatment:    Reason Eval/Treat Not Completed: Other (comment) Pt comfort care. OT signing off.   Mazelle Huebert,HILLARY Oct 09, 2019, 11:51 AM  Maurie Boettcher, OT/L   Acute OT Clinical Specialist Acute Rehabilitation Services Pager [phone] Office [phone]

## 2019-09-14 NOTE — Progress Notes (Signed)
PROGRESS NOTE                                                                                                                                                                                                             Patient Demographics:    Todd Wall, is a 85 y.o. male, DOB - 09-07-1926, GXQ:[id number]  Outpatient Primary MD for the patient is Jerl Mina, MD   Admit date - 09/04/2019   LOS - 2  No chief complaint on file.      Brief Narrative: Patient is a 85 y.o. male with PMHx of OSA-who presented to the ED with several days history of weakness, confusion and shortness of breath-was found to have acute hypoxic respiratory failure in the setting of COVID-19 pneumonia.  Hospital course complicated by worsening hypoxemia and encephalopathy.  After extensive discussion with family-made DNR-and subsequently transition to comfort measures.   Subjective:    Todd Wall today remains very tenuous-this morning he was on 100% NRB along with 15 L of high flow oxygen via nasal cannula.  He remained intermittently confused.  He continues to gurgle and accumulate secretions.  His O2 saturations only in the mid 80s.    Night MD-spoke with patient's spouse-and started some comfort care measures.   Assessment  & Plan :   Acute Hypoxic Resp Failure due to Covid 19 Viral pneumonia and concurrent bacterial/aspiration pneumonia: Continues to deteriorate-continue comfort measures.  Spoke with patient's spouse-x2 this morning-initially she was hesitant to stop antimicrobial therapy-but due to continued deterioration-she is now agreeable to transition to full comfort measures.  She in fact came to the hospital-as patient is at end-of-life-to briefly see him.  Fever: afebrile  O2 requirements:  SpO2: (!) 41 % O2 Flow Rate (L/min): 15 L/min + 100% nonrebreather mask  COVID-19 Labs: Recent Labs    08/21/2019 0122 09/08/19 0720  September 27, 2019 0814  DDIMER 0.80* 0.61* 0.62*  FERRITIN  --  1,603* 1,386*  CRP 21.2* 27.3* 29.4*    Lab Results  Component Value Date   SARSCOV2NAA POSITIVE (A) 09/06/2019     COVID-19 Medications: Steroids: 11/23>> 11/26 Remdesivir: 11/23>> 11/26 Actemra: Not given due to elevated procalcitonin Convalescent Plasma: 11/24  Other medications: Diuretics:Euvolemic-Lasix given AKI Antibiotics: Unasyn>> 11/25>> 11/26 (stopped as transition to full comfort measures)  Prone/Incentive Spirometry: Due to encephalopathy-he will not cooperate.  DVT Prophylaxis  : Held due to thrombocytopenia  Acute metabolic encephalopathy: Secondary to COVID-19 pneumonia with hypoxemia-remains confused-per RN-lucid at times-supportive care  A. fib with RVR: Back in sinus rhythm-continue diltiazem-suspect not a good long-term candidate for anticoagulation given advanced age and frailty.  AKI: Initially had improved but now creatinine is trending up.  Suspect etiology to be hemodynamically mediated.  Thrombocytopenia: Likely secondary to viral illness-should improve with supportive care.  History of bullous pemphigoid: Appears to be on chronic steroids in the outpatient setting.  Steroids discontinued as patient transition to full comfort measures.  Obesity: Estimated body mass index is 36.24 kg/m as calculated from the following:   Height as of 09/06/19:  (1.778 m).   Weight as of this encounter: 114.6 kg.   Palliative care: Unfortunately-hospital course complicated by worsening hypoxemia not only from Covid pneumonia but from aspiration pneumonitis due to severe dysphagia in the setting of altered mental status.  Continues to worsen this morning-continues to accumulate secretions-spoke with spouse x2 today-he has been transitioned to full comfort measures.  Expect inpatient death in a matter of hours/days.  RN pressure injury documentation: Pressure Injury September 20, 2019 Buttocks Mid Stage I -  Intact  skin with non-blanchable redness of a localized area usually over a bony prominence. (Active)  September 20, 2019 0109  Location: Buttocks  Location Orientation: Mid  Staging: Stage I -  Intact skin with non-blanchable redness of a localized area usually over a bony prominence.  Wound Description (Comments):   Present on Admission:     Consults  :  None  Procedures  :  None  ABG:    Component Value Date/Time   HCO3 28.9 (H) 09/06/2019 0922   O2SAT 29.4 09/06/2019 0922    Vent Settings: N/A  Condition -Stable  Family Communication  :  Spouse updated over the phone-x2  Code Status : DNR   Diet :  Diet Order            Diet NPO time specified Except for: Other (See Comments)  Diet effective now               Disposition Plan  :  Remain hospitalized-expect inpatient death  Barriers to discharge: Actively dying-full comfort measures in effect.  Antimicorbials  :    Anti-infectives (From admission, onward)   Start     Dose/Rate Route Frequency Ordered Stop   09/08/19 1230  Ampicillin-Sulbactam (UNASYN) 3 g in sodium chloride 0.9 % 100 mL IVPB  Status:  Discontinued     3 g 200 mL/hr over 30 Minutes Intravenous Every 6 hours 09/08/19 1115 08/21/2019 1313   09/08/19 1000  remdesivir 100 mg in sodium chloride 0.9 % 250 mL IVPB  Status:  Discontinued     100 mg 500 mL/hr over 30 Minutes Intravenous Every 24 hours 09/20/2019 0136 September 20, 2019 0205   09-20-2019 1000  remdesivir 100 mg in sodium chloride 0.9 % 250 mL IVPB  Status:  Discontinued     100 mg 500 mL/hr over 30 Minutes Intravenous Every 24 hours 09/20/2019 0205 09/08/2019 1313      Inpatient Medications  Scheduled Meds: . sodium chloride flush  3 mL Intravenous Q12H   Continuous Infusions: . sodium chloride     PRN Meds:.sodium chloride, diphenhydrAMINE, haloperidol **OR** haloperidol **OR** haloperidol lactate, HYDROmorphone (DILAUDID) injection, LORazepam **OR** LORazepam **OR** LORazepam, LORazepam, ondansetron **OR**  ondansetron (ZOFRAN) IV, sodium chloride flush   Time Spent in minutes  35   See all Orders from today for  further details  Jeoffrey MassedShanker Maripaz Mullan M.D on 2019-04-04 at 1:15 PM  To page go to [email or web address] - use universal password  [facility] -  Office  [phone]    Objective:   Vitals:   08/14/19 1000 08/14/19 1103 08/14/19 1128 08/14/19 1305  BP: (!) 171/52     Pulse: 95 (!) 104 92 90  Resp: (!) 26 (!) 31 14 19   Temp:      TempSrc:      SpO2: (!) 85% (!) 85% (!) 66% (!) 41%  Weight:        Wt Readings from Last 3 Encounters:  08/25/2019 114.6 kg  09/06/19 118 kg  10/30/17 114.7 kg     Intake/Output Summary (Last 24 hours) at 2019-04-04 1315 Last data filed at 2019-04-04 0600 Gross per 24 hour  Intake 253 ml  Output 900 ml  Net -647 ml     Physical Exam GGen Exam: Lethargic-confused-continues to "gurgle" HEENT:atraumatic, normocephalic Chest: Continues to have significantly transmitted upper airway sounds-appears rhonchorous all over CVS:S1S2 regular Abdomen:soft non tender, non distended Extremities:no edema Neurology: Moves all 4 extremities Skin: no rash    Data Review:    CBC Recent Labs  Lab 09/06/19 1040 09/08/19 0720 08/14/19 0814  WBC 7.6 8.8 6.3  HGB 10.5* 10.8* 11.9*  HCT 32.1* 34.2* 38.5*  PLT 82* 73* 91*  MCV 104.6* 109.6* 109.1*  MCH 34.2* 34.6* 33.7  MCHC 32.7 31.6 30.9  RDW 14.6 14.6 14.5  LYMPHSABS 0.9 0.3* 0.5*  MONOABS 0.5 0.2 0.1  EOSABS 0.0 0.0 0.0  BASOSABS 0.0 0.0 0.0    Chemistries  Recent Labs  Lab 09/06/19 0922 09/08/19 0720 08/14/19 0814  NA 142 141 145  K 4.5 4.9 5.2*  CL 106 109 111  CO2 24 24 23   GLUCOSE 152* 219* 324*  BUN 43* 38* 54*  CREATININE 1.68* 1.42* 1.91*  CALCIUM 8.8* 8.1* 8.0*  AST 25 25 22   ALT 20 22 20   ALKPHOS 42 40 37*  BILITOT 0.9 0.6 0.5   ------------------------------------------------------------------------------------------------------------------ No results for  input(s): CHOL, HDL, LDLCALC, TRIG, CHOLHDL, LDLDIRECT in the last 72 hours.  Lab Results  Component Value Date   HGBA1C 7.3 (H) 08/24/2019   ------------------------------------------------------------------------------------------------------------------ No results for input(s): TSH, T4TOTAL, T3FREE, THYROIDAB in the last 72 hours.  Invalid input(s): FREET3 ------------------------------------------------------------------------------------------------------------------ Recent Labs    09/08/19 0720 08/14/19 0814  FERRITIN 1,603* 1,386*    Coagulation profile Recent Labs  Lab 09/06/19 0934  INR 1.0    Recent Labs    09/08/19 0720 08/14/19 0814  DDIMER 0.61* 0.62*    Cardiac Enzymes No results for input(s): CKMB, TROPONINI, MYOGLOBIN in the last 168 hours.  Invalid input(s): CK ------------------------------------------------------------------------------------------------------------------    Component Value Date/Time   BNP 259.9 (H) 09/08/2019 0720    Micro Results Recent Results (from the past 240 hour(s))  Blood Culture (routine x 2)     Status: None (Preliminary result)   Collection Time: 09/06/19  9:34 AM   Specimen: BLOOD  Result Value Ref Range Status   Specimen Description BLOOD LEFT AC  Final   Special Requests   Final    BOTTLES DRAWN AEROBIC AND ANAEROBIC Blood Culture results may not be optimal due to an excessive volume of blood received in culture bottles   Culture   Final    NO GROWTH 3 DAYS Performed at [facility], [address] [place], [place] [address]    Report Status PENDING  Incomplete  Blood Culture (  routine x 2)     Status: None (Preliminary result)   Collection Time: 09/06/19  9:34 AM   Specimen: BLOOD  Result Value Ref Range Status   Specimen Description BLOOD RIGHT ANTECUBITAL  Final   Special Requests   Final    BOTTLES DRAWN AEROBIC AND ANAEROBIC Blood Culture adequate volume   Culture   Final    NO GROWTH 3  DAYS Performed at [facility], [address] [place], [place] [address]    Report Status PENDING  Incomplete  Urine culture     Status: None   Collection Time: 09/06/19  9:34 AM   Specimen: In/Out Cath Urine  Result Value Ref Range Status   Specimen Description   Final    IN/OUT CATH URINE Performed at [facility], [address] [place], [place] [address]    Special Requests   Final    NONE Performed at [facility], [address] [place], [place] [address]    Culture   Final    NO GROWTH Performed at [facility], [place] [address], [place], [place] [address]    Report Status 09-11-2019 FINAL  Final  SARS Coronavirus 2 by RT PCR (hospital order, performed in [facility] hospital lab) Nasopharyngeal Nasopharyngeal Swab     Status: Abnormal   Collection Time: 09/06/19  1:40 PM   Specimen: Nasopharyngeal Swab  Result Value Ref Range Status   SARS Coronavirus 2 POSITIVE (A) NEGATIVE Final    Comment: CRITICAL VALUE NOTED.  VALUE IS CONSISTENT WITH PREVIOUSLY REPORTED AND CALLED VALUE. (NOTE) If result is NEGATIVE SARS-CoV-2 target nucleic acids are NOT DETECTED. The SARS-CoV-2 RNA is generally detectable in upper and lower  respiratory specimens during the acute phase of infection. The lowest  concentration of SARS-CoV-2 viral copies this assay can detect is 250  copies / mL. A negative result does not preclude SARS-CoV-2 infection  and should not be used as the sole basis for treatment or other  patient management decisions.  A negative result may occur with  improper specimen collection / handling, submission of specimen other  than nasopharyngeal swab, presence of viral mutation(s) within the  areas targeted by this assay, and inadequate number of viral copies  (<250 copies / mL). A negative result must be combined with clinical  observations, patient history, and epidemiological information. If result is POSITIVE SARS-CoV-2 target  nucleic acids are DETECTED. Th e SARS-CoV-2 RNA is generally detectable in upper and lower  respiratory specimens during the acute phase of infection.  Positive  results are indicative of active infection with SARS-CoV-2.  Clinical  correlation with patient history and other diagnostic information is  necessary to determine patient infection status.  Positive results do  not rule out bacterial infection or co-infection with other viruses. If result is PRESUMPTIVE POSTIVE SARS-CoV-2 nucleic acids MAY BE PRESENT.   A presumptive positive result was obtained on the submitted specimen  and confirmed on repeat testing.  While 2019 novel coronavirus  (SARS-CoV-2) nucleic acids may be present in the submitted sample  additional confirmatory testing may be necessary for epidemiological  and / or clinical management purposes  to differentiate between  SARS-CoV-2 and other Sarbecovirus currently known to infect humans.  If clinically indicated additional testing with an alternate test  methodology [phone]) is  advised. The SARS-CoV-2 RNA is generally  detectable in upper and lower respiratory specimens during the acute  phase of infection. The expected result is Negative. Fact Sheet  for Patients:  BoilerBrush.com.cy Fact Sheet for Healthcare Providers: https://pope.com/ This test is not yet approved or cleared by the [place] FDA and has been authorized for detection and/or diagnosis of SARS-CoV-2 by FDA under an Emergency Use Authorization (EUA).  This EUA will remain in effect (meaning this test can be used) for the duration of the COVID-19 declaration under Section 564(b)(1) of the Act, 21 U.S.C. section 360bbb-3(b)(1), unless the authorization is terminated or revoked sooner. Performed at [facility], [address], [place], [place] [address]     Radiology Reports Dg Chest Port 1 View  Result Date: 09/06/2019 CLINICAL  DATA:  Dyspnea. EXAM: PORTABLE CHEST 1 VIEW COMPARISON:  Chest radiograph 10/14/2012 FINDINGS: Cardiomegaly. Aortic atherosclerosis. Opacity within the left mid lung suspicion for pneumonia given provided history. Left basilar opacity compatible with atelectasis and/or pneumonia with likely effusion. A more subtle opacity is present at the right lung base. No pneumothorax. No acute bony abnormality. IMPRESSION: Opacities within the left mid lung and left lung base suspicious for pneumonia given provided history. Left basilar atelectasis and effusion also likely present. More subtle opacity at the right lung base, which may reflect incomplete atelectasis or a disc site of infection. Cardiomegaly. Aortic atherosclerosis. Electronically Signed   By: Jackey Loge DO   On: 09/06/2019 10:00

## 2019-09-14 NOTE — Evaluation (Signed)
PT Cancellation Note  Patient Details Name: Todd Wall MRN: [id number] DOB: [birth year]   Cancelled Treatment:    Reason Eval/Treat Not Completed: Other (comment) Pt is now care and comfort, PT will sign off. Thank you for this referral.  Horald Chestnut, PT    Delford Field 08/31/2019, 12:26 PM

## 2019-09-14 DEATH — deceased

## 2020-12-30 IMAGING — DX DG CHEST 1V PORT
1 series · 1 of 1 positions shown · non-contrast
Comparison: Chest radiograph 10/14/2012

CLINICAL DATA: Dyspnea.

EXAM:
PORTABLE CHEST 1 VIEW

[chest ap]
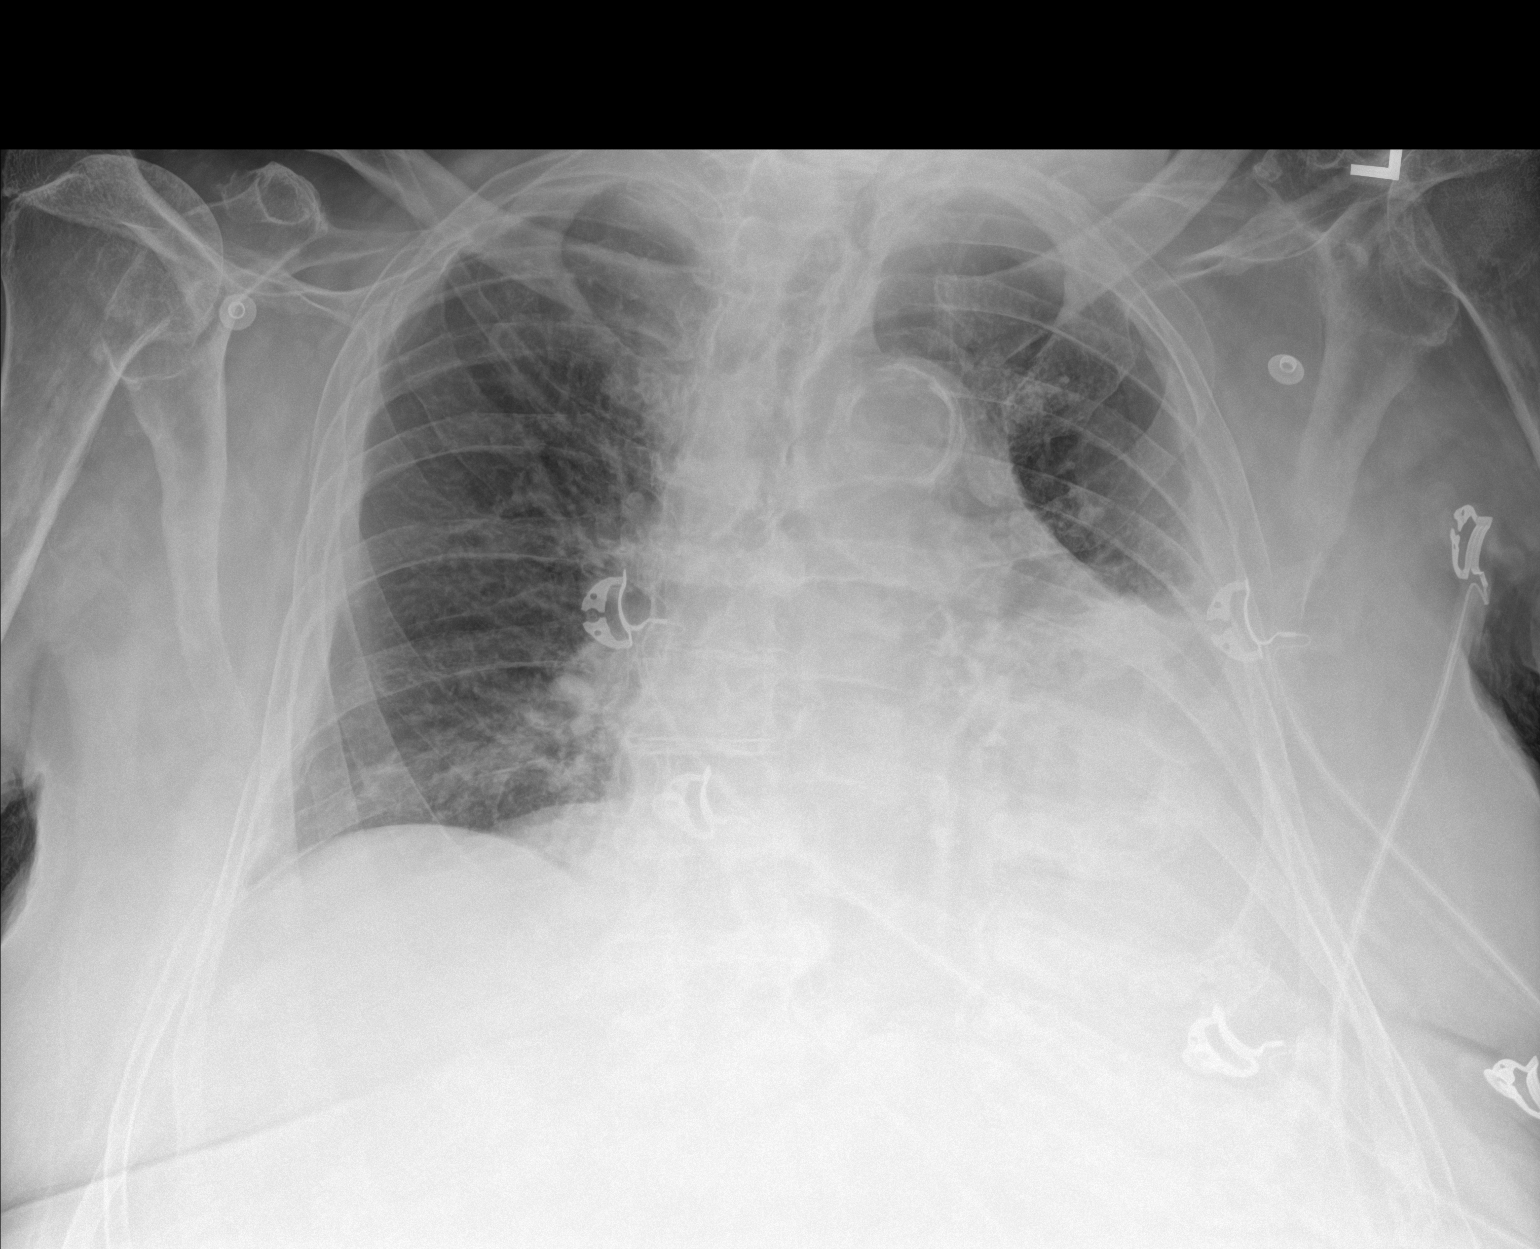

[1 of 1 positions shown; findings below may reference images not displayed]

FINDINGS: Cardiomegaly. Aortic atherosclerosis. Opacity within the left mid
lung suspicion for pneumonia given provided history. Left basilar
opacity compatible with atelectasis and/or pneumonia with likely
effusion. A more subtle opacity is present at the right lung base.
No pneumothorax. No acute bony abnormality.
IMPRESSION: Opacities within the left mid lung and left lung base suspicious for
pneumonia given provided history. Left basilar atelectasis and
effusion also likely present.

More subtle opacity at the right lung base, which may reflect
incomplete atelectasis or a disc site of infection.

Cardiomegaly.

Aortic atherosclerosis.
